# Patient Record
Sex: Female | Born: 1990 | Race: Black or African American | Hispanic: No | Marital: Single | Smoking: Never smoker
Health system: Southern US, Community
[De-identification: ages and names within clinical notes are randomized; demographics above are authoritative.]

## PROBLEM LIST (undated history)

## (undated) ENCOUNTER — Inpatient Hospital Stay (HOSPITAL_COMMUNITY): Payer: Self-pay

## (undated) DIAGNOSIS — Z8759 Personal history of other complications of pregnancy, childbirth and the puerperium: Secondary | ICD-10-CM

## (undated) DIAGNOSIS — Z789 Other specified health status: Secondary | ICD-10-CM

## (undated) DIAGNOSIS — Z8742 Personal history of other diseases of the female genital tract: Secondary | ICD-10-CM

## (undated) HISTORY — PX: NO PAST SURGERIES: SHX2092

## (undated) HISTORY — DX: Personal history of other diseases of the female genital tract: Z87.42

---

## 2010-11-05 ENCOUNTER — Emergency Department (HOSPITAL_COMMUNITY)
Admission: EM | Admit: 2010-11-05 | Discharge: 2010-11-06 | Disposition: A | Discharge status: Home / Self Care | Payer: BC Managed Care – PPO | Attending: Emergency Medicine | Admitting: Emergency Medicine

## 2010-11-05 ENCOUNTER — Encounter: Payer: Self-pay | Admitting: *Deleted

## 2010-11-05 DIAGNOSIS — R102 Pelvic and perineal pain: Secondary | ICD-10-CM

## 2010-11-05 DIAGNOSIS — N949 Unspecified condition associated with female genital organs and menstrual cycle: Secondary | ICD-10-CM | POA: Insufficient documentation

## 2010-11-05 DIAGNOSIS — A499 Bacterial infection, unspecified: Secondary | ICD-10-CM | POA: Insufficient documentation

## 2010-11-05 DIAGNOSIS — B9689 Other specified bacterial agents as the cause of diseases classified elsewhere: Secondary | ICD-10-CM | POA: Insufficient documentation

## 2010-11-05 DIAGNOSIS — M533 Sacrococcygeal disorders, not elsewhere classified: Secondary | ICD-10-CM | POA: Insufficient documentation

## 2010-11-05 DIAGNOSIS — R109 Unspecified abdominal pain: Secondary | ICD-10-CM | POA: Insufficient documentation

## 2010-11-05 DIAGNOSIS — N760 Acute vaginitis: Secondary | ICD-10-CM | POA: Insufficient documentation

## 2010-11-05 LAB — WET PREP, GENITAL

## 2010-11-05 LAB — URINALYSIS, ROUTINE W REFLEX MICROSCOPIC
Ketones, ur: NEGATIVE mg/dL
Leukocytes, UA: NEGATIVE
Nitrite: NEGATIVE

## 2010-11-05 LAB — URINE MICROSCOPIC-ADD ON

## 2010-11-05 LAB — POCT PREGNANCY, URINE: Preg Test, Ur: NEGATIVE

## 2010-11-05 MED ORDER — IBUPROFEN 800 MG PO TABS
800.0000 mg | ORAL_TABLET | Freq: Once | ORAL | Status: AC
  Administered 2010-11-05: 800 mg via ORAL
  Filled 2010-11-05: qty 1

## 2010-11-05 NOTE — ED Notes (Signed)
C/o abdominal pain and back pain x one week, c/o pain in coccyx area

## 2010-11-05 NOTE — ED Provider Notes (Signed)
Scribed for Glynn Octave, MD, the patient was seen in room APA03/APA03. This chart was scribed by [employer]. The patient's care started at 22:05  CSN: [id number] Arrival date & time: 11/05/2010  9:54 PM  Chief Complaint  Patient presents with  . Abdominal Cramping   HPI Nancy Strong is a 20 y.o. female who presents to the Emergency Department complaining of lower abdominal pain with tail bone pain. Patient denies any injuries, vomiting, fever, loss of appetite, vaginal bleeding or discharge. Patient has no history of abdominal surgeries. LNMP June 2012. There are no other associated symptoms and no other alleviating or aggravating factors.  History reviewed. No pertinent past medical history.  History reviewed. No pertinent past surgical history.  No family history on file.  History  Substance Use Topics  . Smoking status: Not on file  . Smokeless tobacco: Not on file  . Alcohol Use: Yes     occassional    OB History    Grav Para Term Preterm Abortions TAB SAB Ect Mult Living                  Review of Systems  Constitutional: Negative for fever and appetite change.  Gastrointestinal: Negative for nausea, vomiting and diarrhea.  Genitourinary: Negative for vaginal bleeding and vaginal discharge.  All other systems reviewed and are negative.    Physical Exam  BP 116/85  Pulse 78  Temp(Src) 98.6 F (37 C) (Oral)  Resp 20  SpO2 100%  LMP 08/18/2010  Physical Exam  Nursing note and vitals reviewed. Constitutional: She is oriented to person, place, and time. She appears well-developed and well-nourished. No distress.  HENT:  Head: Normocephalic and atraumatic.  Mouth/Throat: Oropharynx is clear and moist.  Eyes: Conjunctivae and EOM are normal. Pupils are equal, round, and reactive to light.  Neck: Tracheal deviation present.  Cardiovascular: Normal rate and regular rhythm.   Pulmonary/Chest: Effort normal and breath sounds normal. No respiratory  distress. She has no wheezes.  Abdominal: Soft. Bowel sounds are normal. She exhibits no distension. There is tenderness (suprapubic). There is no rebound and no guarding.       No erythema, no fluctuence no skin change  Genitourinary: Vagina normal and uterus normal. Cervix exhibits no motion tenderness, no discharge and no friability. Right adnexum displays no mass and no tenderness. Left adnexum displays no mass and no tenderness. No vaginal discharge found.  Musculoskeletal: Normal range of motion. She exhibits no edema and no tenderness.       Back:  Neurological: She is alert and oriented to person, place, and time. No cranial nerve deficit.  Skin: Skin is warm and dry. No rash noted. She is not diaphoretic. No erythema. No pallor.  Psychiatric: She has a normal mood and affect. Her behavior is normal.    ED Course  Procedures  OTHER DATA REVIEWED: Nursing notes, vital signs, and past medical records reviewed.    DIAGNOSTIC STUDIES: Oxygen Saturation is 100% on room air, normal by my interpretation.    LABS / RADIOLOGY:  Results for orders placed during the hospital encounter of 11/05/10  URINALYSIS, ROUTINE W REFLEX MICROSCOPIC      Component Value Range   Color, Urine YELLOW  YELLOW    Appearance CLEAR  CLEAR    Specific Gravity, Urine >1.030 (*) 1.005 - 1.030    pH 6.0  5.0 - 8.0    Glucose, UA NEGATIVE  NEGATIVE (mg/dL)   Hgb urine dipstick LARGE (*) NEGATIVE  Bilirubin Urine NEGATIVE  NEGATIVE    Ketones, ur NEGATIVE  NEGATIVE (mg/dL)   Protein, ur TRACE (*) NEGATIVE (mg/dL)   Urobilinogen, UA 0.2  0.0 - 1.0 (mg/dL)   Nitrite NEGATIVE  NEGATIVE    Leukocytes, UA NEGATIVE  NEGATIVE   WET PREP, GENITAL      Component Value Range   Yeast, Wet Prep NONE SEEN  NONE SEEN    Trich, Wet Prep NONE SEEN  NONE SEEN    Clue Cells, Wet Prep FEW (*) NONE SEEN    WBC, Wet Prep HPF POC FEW (*) NONE SEEN   POCT PREGNANCY, URINE      Component Value Range   Preg Test, Ur  NEGATIVE    URINE MICROSCOPIC-ADD ON      Component Value Range   Squamous Epithelial / LPF MANY (*) RARE    WBC, UA 3-6  <3 (WBC/hpf)   RBC / HPF 7-10  <3 (RBC/hpf)   Bacteria, UA FEW (*) RARE      ED COURSE / COORDINATION OF CARE: 22:20 - EDMD examined patient and ordered the folloing Orders Placed This Encounter  Procedures  . Wet prep, genital  . Urinalysis with microscopic  . GC/chlamydia probe amp, genital  . Pelvic cart  . Pregnancy, urine POC     MDM: suprapubic pain without urinary or vaginal symptoms.  Currently on period.  No abnormality seen at coccyx.  UA, pelvic, HCG  No abnormality on pelvic exam. We'll treat for bacterial vaginosis. No evidence of urinary tract infection.   MEDICATIONS GIVEN IN THE E.D.  Medications  ibuprofen (ADVIL,MOTRIN) tablet 800 mg (not administered)    SCRIBE ATTESTATION: I personally performed the services described in this documentation, which was scribed in my presence.  The recorded information has been reviewed and considered.        Glynn Octave, MD 11/06/10 0005

## 2010-11-06 MED ORDER — METRONIDAZOLE 500 MG PO TABS: 500.0000 mg | ORAL_TABLET | Freq: Two times a day (BID) | ORAL | Status: AC

## 2010-11-06 NOTE — ED Notes (Signed)
Pt self ambulated out with a steady gait stating no needs 

## 2010-11-09 LAB — GC/CHLAMYDIA PROBE AMP, GENITAL: Chlamydia, DNA Probe: NEGATIVE

## 2011-07-05 ENCOUNTER — Ambulatory Visit (INDEPENDENT_AMBULATORY_CARE_PROVIDER_SITE_OTHER): Payer: BC Managed Care – PPO | Admitting: Internal Medicine

## 2011-07-05 VITALS — BP 104/68 | HR 75 | Temp 97.9°F | Resp 16 | Ht 60.5 in | Wt 99.0 lb

## 2011-07-05 DIAGNOSIS — R0789 Other chest pain: Secondary | ICD-10-CM

## 2011-07-05 DIAGNOSIS — J301 Allergic rhinitis due to pollen: Secondary | ICD-10-CM

## 2011-07-05 MED ORDER — PREDNISONE 20 MG PO TABS: ORAL_TABLET | ORAL | Status: DC

## 2011-07-05 MED ORDER — FLUTICASONE PROPIONATE 50 MCG/ACT NA SUSP: 2.0000 | Freq: Every day | NASAL | Status: DC

## 2011-07-05 MED ORDER — LORATADINE 10 MG PO CAPS: 10.0000 mg | ORAL_CAPSULE | Freq: Every day | ORAL | Status: DC

## 2011-07-05 NOTE — Progress Notes (Signed)
  Subjective:    Patient ID: Nancy Strong, female    DOB: 1990-07-03, 21 y.o.   MRN: [id number]  HPIFor the past 2 weeks she has been waking up in the middle of the night with a right frontal headache as dull and aching and pressure-like/there is no change in vision, no nausea and vomiting, and no dizziness. She has no history of migraine headaches. 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 Wynne Dust who follows her abnormal Pap smears status post LEEP/she would like to change to an implant or injectable form and will discuss this with her    Objective:   Physical ExamVital signs stable Conjunctiva injected/PE RR LA/EOMs conjugate TMs clear Nares boggy Throat slightly injected No nodes Chest clear Chest wall tender in the right fourth interspace Abdomen is benign      Assessment & Plan:  Problem #1 allergic rhinitis with cough Problem #2 chest wall pain secondary to cough  Reassured Meds ordered this encounter  Medications  . DISCONTD: norethindrone-ethinyl estradiol-iron (MICROGESTIN FE,GILDESS FE,LOESTRIN FE) 1.5-30 MG-MCG tablet    Sig: Take 1 tablet by mouth daily.  . predniSONE (DELTASONE) 20 MG tablet    Sig: 3/3/2/2/1/1 single daily dose for 6 days    Dispense:  12 tablet    Refill:  0  . fluticasone (FLONASE) 50 MCG/ACT nasal spray    Sig: Place 2 sprays into the nose daily.    Dispense:  16 g    Refill:  6    . Loratadine 10 MG CAPS    Sig: Take 1 capsule (10 mg total) by mouth daily.    Dispense:  30 each    Refill:  11   Actually the birth control pills were not discontinued and she gets this prescription from Dr. Hyacinth Meeker 2 followup as needed for primary care

## 2012-05-25 ENCOUNTER — Emergency Department (HOSPITAL_COMMUNITY): Payer: BC Managed Care – PPO

## 2012-05-25 ENCOUNTER — Emergency Department (HOSPITAL_COMMUNITY)
Admission: EM | Admit: 2012-05-25 | Discharge: 2012-05-25 | Disposition: A | Discharge status: Home / Self Care | Payer: BC Managed Care – PPO | Attending: Emergency Medicine | Admitting: Emergency Medicine

## 2012-05-25 ENCOUNTER — Encounter (HOSPITAL_COMMUNITY): Payer: Self-pay | Admitting: *Deleted

## 2012-05-25 DIAGNOSIS — Z3201 Encounter for pregnancy test, result positive: Secondary | ICD-10-CM | POA: Insufficient documentation

## 2012-05-25 DIAGNOSIS — R197 Diarrhea, unspecified: Secondary | ICD-10-CM | POA: Insufficient documentation

## 2012-05-25 DIAGNOSIS — Z87891 Personal history of nicotine dependence: Secondary | ICD-10-CM | POA: Insufficient documentation

## 2012-05-25 DIAGNOSIS — R112 Nausea with vomiting, unspecified: Secondary | ICD-10-CM | POA: Insufficient documentation

## 2012-05-25 DIAGNOSIS — O200 Threatened abortion: Secondary | ICD-10-CM

## 2012-05-25 DIAGNOSIS — F17200 Nicotine dependence, unspecified, uncomplicated: Secondary | ICD-10-CM | POA: Insufficient documentation

## 2012-05-25 LAB — WET PREP, GENITAL
Clue Cells Wet Prep HPF POC: NONE SEEN
Trich, Wet Prep: NONE SEEN
Yeast Wet Prep HPF POC: NONE SEEN

## 2012-05-25 LAB — COMPREHENSIVE METABOLIC PANEL
ALT: 6 U/L (ref 0–35)
AST: 12 U/L (ref 0–37)
Albumin: 3.6 g/dL (ref 3.5–5.2)
Alkaline Phosphatase: 44 U/L (ref 39–117)
Chloride: 102 mEq/L (ref 96–112)
Potassium: 3.4 mEq/L — ABNORMAL LOW (ref 3.5–5.1)
Sodium: 135 mEq/L (ref 135–145)
Total Bilirubin: 0.6 mg/dL (ref 0.3–1.2)

## 2012-05-25 LAB — CBC WITH DIFFERENTIAL/PLATELET
Basophils Absolute: 0 10*3/uL (ref 0.0–0.1)
Basophils Relative: 0 % (ref 0–1)
HCT: 34.9 % — ABNORMAL LOW (ref 36.0–46.0)
MCH: 29.8 pg (ref 26.0–34.0)
MCV: 85.3 fL (ref 78.0–100.0)
Platelets: 199 10*3/uL (ref 150–400)
RBC: 4.09 MIL/uL (ref 3.87–5.11)

## 2012-05-25 LAB — URINALYSIS, ROUTINE W REFLEX MICROSCOPIC
Bilirubin Urine: NEGATIVE
Ketones, ur: 15 mg/dL — AB
Nitrite: NEGATIVE
pH: 6 (ref 5.0–8.0)

## 2012-05-25 LAB — URINE MICROSCOPIC-ADD ON

## 2012-05-25 LAB — ABO/RH: ABO/RH(D): A NEG

## 2012-05-25 MED ORDER — MORPHINE SULFATE 4 MG/ML IJ SOLN
4.0000 mg | Freq: Once | INTRAMUSCULAR | Status: AC
  Administered 2012-05-25: 4 mg via INTRAVENOUS
  Filled 2012-05-25: qty 1

## 2012-05-25 MED ORDER — RHO D IMMUNE GLOBULIN 1500 UNIT/2ML IJ SOLN: 300.0000 ug | Freq: Once | INTRAMUSCULAR | Status: DC

## 2012-05-25 MED ORDER — SODIUM CHLORIDE 0.9 % IV BOLUS (SEPSIS)
1000.0000 mL | Freq: Once | INTRAVENOUS | Status: AC
  Administered 2012-05-25: 1000 mL via INTRAVENOUS

## 2012-05-25 MED ORDER — ONDANSETRON HCL 4 MG/2ML IJ SOLN
4.0000 mg | Freq: Once | INTRAMUSCULAR | Status: AC
  Administered 2012-05-25: 4 mg via INTRAVENOUS
  Filled 2012-05-25: qty 2

## 2012-05-25 MED ORDER — RHO D IMMUNE GLOBULIN 1500 UNIT/2ML IJ SOLN
50.0000 ug | Freq: Once | INTRAMUSCULAR | Status: AC
  Administered 2012-05-25: 49.5 ug via INTRAMUSCULAR

## 2012-05-25 MED ORDER — SODIUM CHLORIDE 0.9 % IV BOLUS (SEPSIS): 1000.0000 mL | Freq: Once | INTRAVENOUS | Status: DC

## 2012-05-25 NOTE — ED Notes (Signed)
Patient transported to Ultrasound 

## 2012-05-25 NOTE — Progress Notes (Signed)
[facility] ED CM noted pt with coverage but no pcp listed Spoke with pt who confirms no pcp but aware of how to obtain one with insurance coverage customer service number or web site  

## 2012-05-25 NOTE — ED Provider Notes (Signed)
History     CSN: [id number]  Arrival date & time 05/25/12  4098   First MD Initiated Contact with Patient 05/25/12 [phone]      Chief Complaint  Patient presents with  . Abdominal Pain  . Emesis  . Diarrhea    (Consider location/radiation/quality/duration/timing/severity/associated sxs/prior treatment) HPI Comments: Patient presents with severe lower abdominal cramping that started this morning 8am. She reports irregular periods in February and started bleeding yesterday with clots. She said several episodes of nausea and vomiting this morning with loose stools. Denies any fevers. Denies any dysuria hematuria. Has had intermittent cramps over the past 2 weeks but never this severe. No previous history of gynecological problems. She's never been pregnant. She denies any chest pain or shortness of breath. She states it's hard to breathe though because of the pain.  The history is provided by the patient and a relative. The history is limited by the condition of the patient.    History reviewed. No pertinent past medical history.  History reviewed. No pertinent past surgical history.  No family history on file.  History  Substance Use Topics  . Smoking status: Former Smoker -- 0.50 packs/day  . Smokeless tobacco: Not on file  . Alcohol Use: Yes     Comment: occassional    OB History   Grav Para Term Preterm Abortions TAB SAB Ect Mult Living                  Review of Systems  Constitutional: Negative for activity change and appetite change.  HENT: Negative for nosebleeds and congestion.   Respiratory: Negative for cough, chest tightness and shortness of breath.   Cardiovascular: Negative for chest pain.  Gastrointestinal: Positive for vomiting, abdominal pain and diarrhea.  Genitourinary: Positive for vaginal bleeding. Negative for dysuria and hematuria.  Musculoskeletal: Negative for back pain.  Skin: Negative for wound.  Neurological: Positive for weakness. Negative for  dizziness and headaches.  A complete 10 system review of systems was obtained and all systems are negative except as noted in the HPI and PMH.    Allergies  Review of patient's allergies indicates no known allergies.  Home Medications   Current Outpatient Rx  Name  Route  Sig  Dispense  Refill  . ibuprofen (ADVIL,MOTRIN) 200 MG tablet   Oral   Take 400 mg by mouth every 8 (eight) hours as needed for pain.           BP 124/53  Pulse 104  Temp(Src) 97.7 F (36.5 C) (Oral)  Resp 16  SpO2 100%  LMP 05/11/2012  Physical Exam  Constitutional: She is oriented to person, place, and time. She appears well-developed and well-nourished. She appears distressed.  Uncomfortable, hyperventilating  HENT:  Head: Normocephalic and atraumatic.  Mouth/Throat: Oropharynx is clear and moist. No oropharyngeal exudate.  Eyes: Conjunctivae are normal. Pupils are equal, round, and reactive to light.  Neck: Normal range of motion. Neck supple.  Cardiovascular: Normal rate, regular rhythm and normal heart sounds.   No murmur heard. Pulmonary/Chest: Effort normal and breath sounds normal. No respiratory distress.  Abdominal: Soft. There is tenderness. There is no rebound and no guarding.  Suprapubic tenderness without peritoneal signs No pain at McBurney's point  Genitourinary: Cervix exhibits no motion tenderness. Right adnexum displays no mass and no tenderness. Left adnexum displays no mass and no tenderness. There is bleeding around the vagina.  Dark red blood in the vaginal vault, cervix closed, no adnexal tenderness or CMT  Musculoskeletal: Normal range of motion. She exhibits no edema and no tenderness.  No CVAT  Neurological: She is alert and oriented to person, place, and time. No cranial nerve deficit. She exhibits normal muscle tone. Coordination normal.  Skin: Skin is warm.    ED Course  Procedures (including critical care time)  Labs Reviewed  WET PREP, GENITAL - Abnormal;  Notable for the following:    WBC, Wet Prep HPF POC FEW (*)    All other components within normal limits  URINALYSIS, ROUTINE W REFLEX MICROSCOPIC - Abnormal; Notable for the following:    Color, Urine RED (*)    APPearance CLOUDY (*)    Specific Gravity, Urine 1.031 (*)    Hgb urine dipstick LARGE (*)    Ketones, ur 15 (*)    Protein, ur 100 (*)    Leukocytes, UA SMALL (*)    All other components within normal limits  PREGNANCY, URINE - Abnormal; Notable for the following:    Preg Test, Ur POSITIVE (*)    All other components within normal limits  CBC WITH DIFFERENTIAL - Abnormal; Notable for the following:    HCT 34.9 (*)    All other components within normal limits  COMPREHENSIVE METABOLIC PANEL - Abnormal; Notable for the following:    Potassium 3.4 (*)    Glucose, Bld 119 (*)    All other components within normal limits  HCG, QUANTITATIVE, PREGNANCY - Abnormal; Notable for the following:    hCG, Beta Chain, Quant, S 336 (*)    All other components within normal limits  GC/CHLAMYDIA PROBE AMP  URINE MICROSCOPIC-ADD ON  ABO/RH  RH IG WORKUP (INCLUDES ABO/RH)   US Ob Comp Less 14 Wks  05/25/2012  *RADIOLOGY REPORT*  Clinical Data: Abdominal pain and emesis.  Quantitative beta HCG 336  OBSTETRIC <14 WK [place] AND TRANSVAGINAL OB US  Technique:  Both transabdominal and transvaginal ultrasound examinations were performed for complete evaluation of the gestation as well as the maternal uterus, adnexal regions, and pelvic cul-de-sac.  Transvaginal technique was performed to assess early pregnancy.  Comparison:  None.  Intrauterine gestational sac:  Not seen Yolk sac: Not applicable Embryo: Not applicable Cardiac Activity: Not applicable Heart Rate: Not applicable bpm  A thin endometrial stripe is identified measuring 6 mm in diameter with no definite areas of thickening.  Maternal uterus/adnexae: Both ovaries are seen with the left ovary measuring 2.5 x 3.5 x 2.2 cm and the right ovary  measuring 3.1 x 3.1 x 2.6 cm.  Medial and posterior to the right ovary and appearing separate from the right ovary on cine loop evaluation is a hyperechoic rounded soft tissue density measuring 1.6 by 1.6 x 1.7 cm.  This demonstrates some peripheral flow with color Doppler exam and the appearance is suspicious for a right ectopic gestation. A small amount of simple free fluid is noted in the cul-de-sac.  No associated complex fluid is identified.  IMPRESSION: No evidence for an intrauterine gestational sac.  Rounded soft tissue mass appearing extraovarian in the right adnexa as described above with associated color flow with color Doppler assessment is suspicious for right ectopic gestation.  Normal ovaries.  This study was called as a critical result to Dr. Manus Gunning on 05/25/2012 at 11:50 am   Original Report Authenticated By: Rhodia Albright, M.D.    US Ob Transvaginal  05/25/2012  *RADIOLOGY REPORT*  Clinical Data: Abdominal pain and emesis.  Quantitative beta HCG 336  OBSTETRIC <14 WK [place] AND TRANSVAGINAL  OB US  Technique:  Both transabdominal and transvaginal ultrasound examinations were performed for complete evaluation of the gestation as well as the maternal uterus, adnexal regions, and pelvic cul-de-sac.  Transvaginal technique was performed to assess early pregnancy.  Comparison:  None.  Intrauterine gestational sac:  Not seen Yolk sac: Not applicable Embryo: Not applicable Cardiac Activity: Not applicable Heart Rate: Not applicable bpm  A thin endometrial stripe is identified measuring 6 mm in diameter with no definite areas of thickening.  Maternal uterus/adnexae: Both ovaries are seen with the left ovary measuring 2.5 x 3.5 x 2.2 cm and the right ovary measuring 3.1 x 3.1 x 2.6 cm.  Medial and posterior to the right ovary and appearing separate from the right ovary on cine loop evaluation is a hyperechoic rounded soft tissue density measuring 1.6 by 1.6 x 1.7 cm.  This demonstrates some peripheral flow  with color Doppler exam and the appearance is suspicious for a right ectopic gestation. A small amount of simple free fluid is noted in the cul-de-sac.  No associated complex fluid is identified.  IMPRESSION: No evidence for an intrauterine gestational sac.  Rounded soft tissue mass appearing extraovarian in the right adnexa as described above with associated color flow with color Doppler assessment is suspicious for right ectopic gestation.  Normal ovaries.  This study was called as a critical result to Dr. Manus Gunning on 05/25/2012 at 11:50 am   Original Report Authenticated By: Rhodia Albright, M.D.      No diagnosis found.    MDM  Severe lower abdominal cramping with vaginal bleeding since this morning. Associated with nausea, vomiting, weakness, diarrhea. Unclear pregnancy test status.  Pregnancy test positive. Pelvic exam benign. Ultrasound will be obtained with beta hCG and Rh  HCG 336. Rh negative. Given Rhogam. Patient has suspicious right adnexal mass measuring 1.6 x 1.6 x 1.6 cm. This was discussed with Dr. Debroah Loop of obstetrics. As patient is a hemodynamically stable and pain-free at this time without significant bleeding, he would like her to return to [facility] hospital in 48 hours for a hCG recheck.  Patient understands need for close followup in 48 hours.  Instructed to return to [facility] hospital sooner with worsening pain, worsening bleeding, dizziness, lightheadedness or any other concerns.  Glynn Octave, MD 05/25/12 1550

## 2012-05-25 NOTE — ED Notes (Signed)
Pt aware of the need for a urine sample. 

## 2012-05-25 NOTE — ED Notes (Addendum)
Pt reports abd pain x2 weeks. Pain unbearable since waking this am approx 8am. Emesis x1, diarrhea x5. Pt reports she is on her period and has been passing large clots. Reports a syncopal episode this am in bathroom. Lower central abd pain. Pt reports missing Feb period, periods irregular. Sts period she is having now is unusual for her, started and stopped and restarted.

## 2012-05-26 LAB — RH IG WORKUP (INCLUDES ABO/RH): Antibody Screen: NEGATIVE

## 2012-05-27 ENCOUNTER — Inpatient Hospital Stay (HOSPITAL_COMMUNITY)
Admission: AD | Admit: 2012-05-27 | Discharge: 2012-05-27 | Disposition: A | Discharge status: Home / Self Care | Payer: BC Managed Care – PPO | Source: Ambulatory Visit | Attending: Obstetrics and Gynecology | Admitting: Obstetrics and Gynecology

## 2012-05-27 DIAGNOSIS — O009 Unspecified ectopic pregnancy without intrauterine pregnancy: Secondary | ICD-10-CM

## 2012-05-27 DIAGNOSIS — O00109 Unspecified tubal pregnancy without intrauterine pregnancy: Secondary | ICD-10-CM | POA: Insufficient documentation

## 2012-05-27 LAB — CBC WITH DIFFERENTIAL/PLATELET
Basophils Relative: 1 % (ref 0–1)
Eosinophils Absolute: 0 10*3/uL (ref 0.0–0.7)
Eosinophils Relative: 1 % (ref 0–5)
Hemoglobin: 11.7 g/dL — ABNORMAL LOW (ref 12.0–15.0)
Lymphs Abs: 1.5 10*3/uL (ref 0.7–4.0)
MCH: 30.2 pg (ref 26.0–34.0)
MCHC: 35 g/dL (ref 30.0–36.0)
MCV: 86.3 fL (ref 78.0–100.0)
Monocytes Absolute: 0.3 10*3/uL (ref 0.1–1.0)
Monocytes Relative: 8 % (ref 3–12)
RBC: 3.87 MIL/uL (ref 3.87–5.11)

## 2012-05-27 LAB — CREATININE, SERUM: GFR calc Af Amer: >90 mL/min (ref >90)

## 2012-05-27 MED ORDER — METHOTREXATE INJECTION FOR WOMEN'S HOSPITAL
50.0000 mg/m2 | Freq: Once | INTRAMUSCULAR | Status: AC
  Administered 2012-05-27: 70 mg via INTRAMUSCULAR
  Filled 2012-05-27: qty 1.4

## 2012-05-27 NOTE — MAU Provider Note (Signed)
Attestation of Attending Supervision of Advanced Practitioner: Evaluation and management procedures were performed by the PA/NP/CNM/OB Fellow under my supervision/collaboration. Chart reviewed and agree with management and plan.  Tilda Burrow 05/27/2012 9:38 PM

## 2012-05-27 NOTE — Progress Notes (Signed)
Pt waiting for Lab results and methotrexate injection. POC discussed with pt by Dr. Emelda Fear. Pt agreeable.

## 2012-05-27 NOTE — MAU Note (Signed)
Pt here for f/u BHCG. Told she may have an ectopic pregnancy and to f/u  At MAU in 48 hr. Pt reports no pain and bleeding has decreased a lot.

## 2012-05-27 NOTE — MAU Provider Note (Signed)
History     CSN: [id number]  Arrival date and time: 05/27/12 1236   First Provider Initiated Contact with Patient 05/27/12 1326      Chief Complaint  Patient presents with  . Follow-up   HPI Nancy Strong 22 y.o.   LMP 05-11-12.  Was seen in [facility] ED on 05-25-12 and diagnosed with early pregnancy.  Quant was 336 and U/S did not show IUGS.  Suspicious for ectopic pregnancy and so is following up for quant today.  Did receive Rhogam at last visit as blood type is A negative.   OB History   Grav Para Term Preterm Abortions TAB SAB Ect Mult Living                  No past medical history on file.  No past surgical history on file.  No family history on file.  History  Substance Use Topics  . Smoking status: Former Smoker -- 0.50 packs/day  . Smokeless tobacco: Not on file  . Alcohol Use: Yes     Comment: occassional    Allergies: No Known Allergies  Prescriptions prior to admission  Medication Sig Dispense Refill  . ibuprofen (ADVIL,MOTRIN) 200 MG tablet Take 400 mg by mouth every 8 (eight) hours as needed for pain.        Review of Systems  Constitutional: Negative for fever.  Gastrointestinal: Negative for abdominal pain.  Genitourinary: Negative for dysuria.       Vaginal bleeding   Physical Exam   Blood pressure 120/71, pulse 81, resp. rate 18, height 5\' 2"  (1.575 m), weight 102 lb 3.2 oz (46.358 kg), last menstrual period 05/11/2012.  Physical Exam  Nursing note and vitals reviewed. Constitutional: She is oriented to person, place, and time. She appears well-developed and well-nourished. No distress.  HENT:  Head: Normocephalic.  Eyes: EOM are normal.  Neck: Neck supple.  Musculoskeletal: Normal range of motion.  Neurological: She is alert and oriented to person, place, and time.  Skin: Skin is warm and dry.  Psychiatric: She has a normal mood and affect.    MAU Course  Procedures Results for orders placed during the hospital encounter of  05/27/12 (from the past 24 hour(s))  HCG, QUANTITATIVE, PREGNANCY     Status: Abnormal   Collection Time    05/27/12  1:15 PM      Result Value Range   hCG, Beta Chain, Quant, S 170 (*) <5 mIU/mL  CBC WITH DIFFERENTIAL     Status: Abnormal   Collection Time    05/27/12  1:15 PM      Result Value Range   WBC 3.6 (*) 4.0 - 10.5 K/uL   RBC 3.87  3.87 - 5.11 MIL/uL   Hemoglobin 11.7 (*) 12.0 - 15.0 g/dL   HCT 11.9 (*) 14.7 - 82.9 %   MCV 86.3  78.0 - 100.0 fL   MCH 30.2  26.0 - 34.0 pg   MCHC 35.0  30.0 - 36.0 g/dL   RDW 56.2  13.0 - 86.5 %   Platelets 215  150 - 400 K/uL   Neutrophils Relative 49  43 - 77 %   Neutro Abs 1.8  1.7 - 7.7 K/uL   Lymphocytes Relative 41  12 - 46 %   Lymphs Abs 1.5  0.7 - 4.0 K/uL   Monocytes Relative 8  3 - 12 %   Monocytes Absolute 0.3  0.1 - 1.0 K/uL   Eosinophils Relative 1  0 - 5 %  Eosinophils Absolute 0.0  0.0 - 0.7 K/uL   Basophils Relative 1  0 - 1 %   Basophils Absolute 0.0  0.0 - 0.1 K/uL  AST     Status: None   Collection Time    05/27/12  1:15 PM      Result Value Range   AST 12  0 - 37 U/L  BUN     Status: None   Collection Time    05/27/12  1:15 PM      Result Value Range   BUN 10  6 - 23 mg/dL  CREATININE, SERUM     Status: None   Collection Time    05/27/12  1:15 PM      Result Value Range   Creatinine, Ser 0.80  0.50 - 1.10 mg/dL   GFR calc non Af Amer >90  >90 mL/min   GFR calc Af Amer >90  >90 mL/min   MDM 1440  Dr Emelda Fear in to talk with client re: plan of care - will give Methotrexate.  Assessment and Plan  Ectopic pregnancy  Plan  Methotrexate given Return on Wednesday (Day 4)  and Saturday  (Day 7) for repeat labs Return sooner if you have severe pain or severe bleeding. Nothing in the vagina - no sex, no douching, no tampons.  Nancy Strong 05/27/2012, 1:27 PM

## 2012-05-30 ENCOUNTER — Inpatient Hospital Stay (HOSPITAL_COMMUNITY)
Admission: AD | Admit: 2012-05-30 | Discharge: 2012-05-30 | Disposition: A | Discharge status: Home / Self Care | Payer: BC Managed Care – PPO | Source: Ambulatory Visit | Attending: Obstetrics & Gynecology | Admitting: Obstetrics & Gynecology

## 2012-05-30 ENCOUNTER — Encounter (HOSPITAL_COMMUNITY): Payer: Self-pay | Admitting: *Deleted

## 2012-05-30 DIAGNOSIS — O009 Unspecified ectopic pregnancy without intrauterine pregnancy: Secondary | ICD-10-CM

## 2012-05-30 DIAGNOSIS — O00109 Unspecified tubal pregnancy without intrauterine pregnancy: Secondary | ICD-10-CM | POA: Insufficient documentation

## 2012-05-30 HISTORY — DX: Other specified health status: Z78.9

## 2012-05-30 LAB — HCG, QUANTITATIVE, PREGNANCY: hCG, Beta Chain, Quant, S: 38 m[IU]/mL — ABNORMAL HIGH (ref <5)

## 2012-05-30 MED ORDER — ONDANSETRON 8 MG PO TBDP
8.0000 mg | ORAL_TABLET | Freq: Once | ORAL | Status: AC
  Administered 2012-05-30: 8 mg via ORAL
  Filled 2012-05-30: qty 1

## 2012-05-30 MED ORDER — ONDANSETRON 8 MG PO TBDP
8.0000 mg | ORAL_TABLET | Freq: Three times a day (TID) | ORAL | Status: DC | PRN
Start: 2012-05-30 — End: 2020-07-23

## 2012-05-30 NOTE — MAU Provider Note (Signed)
  History     CSN: [id number]  Arrival date and time: 05/30/12 1105   provider seen at 12:44 pm    Chief Complaint  Patient presents with  . Follow-up   HPI Nancy Strong 22 y.o. Comes in for Day 4 quant after methotrexate on 05-27-12.  Quant was 170 that day.  Reports no pain and no bleeding today.  OB History   Grav Para Term Preterm Abortions TAB SAB Ect Mult Living                  No past medical history on file.  No past surgical history on file.  No family history on file.  History  Substance Use Topics  . Smoking status: Former Smoker -- 0.50 packs/day  . Smokeless tobacco: Not on file  . Alcohol Use: Yes     Comment: occassional    Allergies: No Known Allergies  No prescriptions prior to admission    Review of Systems  Gastrointestinal: Negative for abdominal pain.  Genitourinary:       No abdominal pain.   Physical Exam   Blood pressure 115/81, pulse 85, temperature 97.9 F (36.6 C), temperature source Oral, resp. rate 16, last menstrual period 05/11/2012, SpO2 100.00%.  Physical Exam  Nursing note and vitals reviewed. Constitutional: She is oriented to person, place, and time. She appears well-developed and well-nourished.  HENT:  Head: Normocephalic.  Eyes: EOM are normal.  Neck: Neck supple.  Musculoskeletal: Normal range of motion.  Neurological: She is alert and oriented to person, place, and time.  Skin: Skin is warm and dry.  Psychiatric: She has a normal mood and affect.    MAU Course  Procedures Results for orders placed during the hospital encounter of 05/30/12 (from the past 24 hour(s))  HCG, QUANTITATIVE, PREGNANCY     Status: Abnormal   Collection Time    05/30/12 11:15 AM      Result Value Range   hCG, Beta Chain, Quant, S 38 (*) <5 mIU/mL   MDM Client was initially fine on arrival to MAU.  Became nauseated while waiting in lobby.  When I came to talk with her she was having shortness of breath.  O2 sat 100%.   Pulse rate was 110-120.  Feeling urge to spit and thought she would vomit.  Has been having vomiting the past couple of days.  Was very tired at home and could not go to work.  Zofran ODT given and Sprite given with crackers.  Client reports to the nurse she was feeling anxious about this pregnancy and the loss.  O2 sat is always 100%.  Pulse rate decreased as client waited in room.  I feel she is stable but will give a note to be out of work until next blood draw on Saturday.  Suspect her symptoms are related to anxiety or to side effects of methotrexate.  Assessment and Plan  Follow up from Methotrexate on 05-27-12  Plan Appropriate falling quant Return as scheduled for repeat labs on Saturday. rx zofran ODT   Linc Renne 05/30/2012, 12:31 PM

## 2012-05-30 NOTE — MAU Note (Signed)
Patient to MAU for day 4 BHCG s/p MTX. Patient denies any pain today and only spotting. States she had heavier bleeding with some abdominal pain last night. States she continues to have nausea.

## 2012-06-02 ENCOUNTER — Inpatient Hospital Stay (HOSPITAL_COMMUNITY)
Admission: AD | Admit: 2012-06-02 | Discharge: 2012-06-02 | Disposition: A | Discharge status: Home / Self Care | Payer: BC Managed Care – PPO | Source: Ambulatory Visit | Attending: Obstetrics & Gynecology | Admitting: Obstetrics & Gynecology

## 2012-06-02 ENCOUNTER — Encounter (HOSPITAL_COMMUNITY): Payer: Self-pay | Admitting: *Deleted

## 2012-06-02 DIAGNOSIS — O009 Unspecified ectopic pregnancy without intrauterine pregnancy: Secondary | ICD-10-CM

## 2012-06-02 DIAGNOSIS — O00109 Unspecified tubal pregnancy without intrauterine pregnancy: Secondary | ICD-10-CM | POA: Insufficient documentation

## 2012-06-02 LAB — HCG, QUANTITATIVE, PREGNANCY: hCG, Beta Chain, Quant, S: 12 m[IU]/mL — ABNORMAL HIGH (ref <5)

## 2012-06-02 NOTE — MAU Note (Signed)
Pt reports no pain or bleeding and she feel "way better".

## 2012-06-02 NOTE — MAU Provider Note (Signed)
History     CSN: [id number]  Arrival date & time 06/02/12  1149   None     Chief Complaint  Patient presents with  . Follow-up    (Consider location/radiation/quality/duration/timing/severity/associated sxs/prior treatment) HPI Nancy Strong is a 22 y.o. G2P0. She is here for f/u BHCG after MTX for R ectopic pregnancy on 3/23. 1st ED visit 3/21 BHCG was 336, U/S showed 1.6x1.6x1.7 cm soft rounded R adnexal tissue mass. Repeat BHCG 3/23 was 170, MTX was given.  BHCG day 4 was 38, she had some N&V. Today denies bleeding, abd pain, dizziness or N&V. Has not needed Zofran.   Past Medical History  Diagnosis Date  . Medical history non-contributory     Past Surgical History  Procedure Laterality Date  . No past surgeries      Family History  Problem Relation Age of Onset  . Hypertension Father     History  Substance Use Topics  . Smoking status: Former Smoker -- 0.50 packs/day    Types: Cigars  . Smokeless tobacco: Not on file  . Alcohol Use: No     Comment: occassional    OB History   Grav Para Term Preterm Abortions TAB SAB Ect Mult Living   2               Review of Systems  Constitutional: Negative for fever, chills and fatigue.  Gastrointestinal: Negative for abdominal pain.  Genitourinary: Negative for vaginal bleeding.  Neurological: Negative for dizziness, syncope and weakness.    Allergies  Review of patient's allergies indicates no known allergies.  Home Medications  No current outpatient prescriptions on file.  BP 129/71  Pulse 103  Temp(Src) 98.2 F (36.8 C)  Resp 18  LMP 05/11/2012  Physical Exam  Constitutional: She appears well-developed and well-nourished.  Musculoskeletal: Normal range of motion.  Neurological: She is alert.    ED Course  Procedures (including critical care time)  Labs Reviewed  HCG, QUANTITATIVE, PREGNANCY - Abnormal; Notable for the following:    hCG, Beta Chain, Quant, S 12 (*)    All other  components within normal limits   No results found.   No diagnosis found.    MDM  A. Ectopic pregnancytreatedw ithMTX, day 7 BHCG down to 12 P. Return 1 wk next [facility] Ectopic precautions reviewed again.

## 2012-06-03 NOTE — MAU Provider Note (Signed)
Attestation of Attending Supervision of Advanced Practitioner (PA/CNM/NP): Evaluation and management procedures were performed by the Advanced Practitioner under my supervision and collaboration.  I have reviewed the Advanced Practitioner's note and chart, and I agree with the management and plan.  Bentley Haralson, MD, FACOG Attending Obstetrician & Gynecologist Faculty Practice, [facility]  

## 2012-06-09 ENCOUNTER — Inpatient Hospital Stay (HOSPITAL_COMMUNITY)
Admission: AD | Admit: 2012-06-09 | Discharge: 2012-06-09 | Disposition: A | Discharge status: Home / Self Care | Payer: BC Managed Care – PPO | Source: Ambulatory Visit | Attending: Family Medicine | Admitting: Family Medicine

## 2012-06-09 DIAGNOSIS — O00109 Unspecified tubal pregnancy without intrauterine pregnancy: Secondary | ICD-10-CM | POA: Insufficient documentation

## 2012-06-09 DIAGNOSIS — O009 Unspecified ectopic pregnancy without intrauterine pregnancy: Secondary | ICD-10-CM

## 2012-06-09 DIAGNOSIS — Z09 Encounter for follow-up examination after completed treatment for conditions other than malignant neoplasm: Secondary | ICD-10-CM

## 2012-06-09 LAB — HCG, QUANTITATIVE, PREGNANCY: hCG, Beta Chain, Quant, S: 1 m[IU]/mL (ref <5)

## 2012-06-09 NOTE — MAU Provider Note (Signed)
  History     CSN: [id number]  Arrival date and time: 06/09/12 1122   None     Chief Complaint  Patient presents with  . Labs Only   HPI Nancy Strong is 22 y.o. G2P0 Unknown weeks presenting for follow up BHCG after treatment for Ectopic pregnancy with MTX.  Her last BHCG was 12.  She denies bleeding or pain today.      Past Medical History  Diagnosis Date  . Medical history non-contributory     Past Surgical History  Procedure Laterality Date  . No past surgeries      Family History  Problem Relation Age of Onset  . Hypertension Father     History  Substance Use Topics  . Smoking status: Former Smoker -- 0.50 packs/day    Types: Cigars  . Smokeless tobacco: Not on file  . Alcohol Use: No     Comment: occassional    Allergies: No Known Allergies  Prescriptions prior to admission  Medication Sig Dispense Refill  . ondansetron (ZOFRAN ODT) 8 MG disintegrating tablet Take 1 tablet (8 mg total) by mouth every 8 (eight) hours as needed for nausea.  12 tablet  0    Review of Systems  Gastrointestinal: Negative for abdominal pain.  Genitourinary:       Neg for vaginal bleeding   Physical Exam   Blood pressure 131/64, pulse 73, temperature 98 F (36.7 C), temperature source Oral, resp. rate 18, last menstrual period 05/11/2012.  Physical Exam  Constitutional: She is oriented to person, place, and time. She appears well-developed and well-nourished. No distress.  HENT:  Head: Normocephalic.  Genitourinary:  Not indicated  Neurological: She is alert and oriented to person, place, and time.  Skin: Skin is warm and dry.  Psychiatric: She has a normal mood and affect. Her behavior is normal.    Results for orders placed during the hospital encounter of 06/09/12 (from the past 24 hour(s))  HCG, QUANTITATIVE, PREGNANCY     Status: None   Collection Time    06/09/12 11:40 AM      Result Value Range   hCG, Beta Chain, Quant, S 1  <5 mIU/mL   MAU Course   Procedures  MDM Form completed by Norva Pavlov for week out of work  Assessment and Plan  A: Follow up after MTX for ectopic pregnancy  P:  Follow up in GYN CLINIC in 2 weeks  Nancy Strong,EVE M 06/09/2012, 12:38 PM

## 2012-06-09 NOTE — MAU Provider Note (Signed)
Chart reviewed and agree with management and plan.  

## 2012-06-09 NOTE — MAU Note (Signed)
Pt presents for repeat lab draw for BHCG, denies any pain

## 2013-04-07 ENCOUNTER — Encounter (HOSPITAL_COMMUNITY): Payer: Self-pay | Admitting: *Deleted

## 2014-01-06 ENCOUNTER — Encounter (HOSPITAL_COMMUNITY): Payer: Self-pay | Admitting: *Deleted

## 2016-08-22 DIAGNOSIS — O00101 Right tubal pregnancy without intrauterine pregnancy: Secondary | ICD-10-CM | POA: Insufficient documentation

## 2019-03-03 DIAGNOSIS — O365990 Maternal care for other known or suspected poor fetal growth, unspecified trimester, not applicable or unspecified: Secondary | ICD-10-CM

## 2019-03-04 DIAGNOSIS — O365990 Maternal care for other known or suspected poor fetal growth, unspecified trimester, not applicable or unspecified: Secondary | ICD-10-CM

## 2020-01-13 ENCOUNTER — Ambulatory Visit: Admit: 2020-01-13 | Discharge status: Against Medical Advice | Payer: Self-pay

## 2020-01-13 ENCOUNTER — Other Ambulatory Visit: Payer: Self-pay

## 2020-01-13 ENCOUNTER — Inpatient Hospital Stay (HOSPITAL_COMMUNITY): Payer: Managed Care, Other (non HMO)

## 2020-01-13 ENCOUNTER — Encounter: Payer: Self-pay | Admitting: Emergency Medicine

## 2020-01-13 ENCOUNTER — Inpatient Hospital Stay (HOSPITAL_COMMUNITY)
Admission: AD | Admit: 2020-01-13 | Discharge: 2020-01-13 | Disposition: A | Discharge status: Home / Self Care | Payer: Managed Care, Other (non HMO) | Attending: Obstetrics and Gynecology | Admitting: Obstetrics and Gynecology

## 2020-01-13 ENCOUNTER — Ambulatory Visit
Admission: EM | Admit: 2020-01-13 | Discharge: 2020-01-13 | Disposition: A | Discharge status: Other Acute Inpatient Hospital | Payer: Managed Care, Other (non HMO) | Attending: Emergency Medicine | Admitting: Emergency Medicine

## 2020-01-13 ENCOUNTER — Encounter (HOSPITAL_COMMUNITY): Payer: Self-pay | Admitting: Obstetrics and Gynecology

## 2020-01-13 DIAGNOSIS — O209 Hemorrhage in early pregnancy, unspecified: Secondary | ICD-10-CM | POA: Diagnosis present

## 2020-01-13 DIAGNOSIS — O0911 Supervision of pregnancy with history of ectopic or molar pregnancy, first trimester: Secondary | ICD-10-CM | POA: Diagnosis not present

## 2020-01-13 DIAGNOSIS — O039 Complete or unspecified spontaneous abortion without complication: Secondary | ICD-10-CM | POA: Diagnosis not present

## 2020-01-13 DIAGNOSIS — O4691 Antepartum hemorrhage, unspecified, first trimester: Secondary | ICD-10-CM | POA: Diagnosis not present

## 2020-01-13 DIAGNOSIS — R109 Unspecified abdominal pain: Secondary | ICD-10-CM | POA: Diagnosis not present

## 2020-01-13 DIAGNOSIS — N939 Abnormal uterine and vaginal bleeding, unspecified: Secondary | ICD-10-CM

## 2020-01-13 DIAGNOSIS — Z3A10 10 weeks gestation of pregnancy: Secondary | ICD-10-CM | POA: Diagnosis not present

## 2020-01-13 DIAGNOSIS — R1030 Lower abdominal pain, unspecified: Secondary | ICD-10-CM

## 2020-01-13 DIAGNOSIS — Z3200 Encounter for pregnancy test, result unknown: Secondary | ICD-10-CM

## 2020-01-13 DIAGNOSIS — O26891 Other specified pregnancy related conditions, first trimester: Secondary | ICD-10-CM | POA: Diagnosis not present

## 2020-01-13 DIAGNOSIS — Z3201 Encounter for pregnancy test, result positive: Secondary | ICD-10-CM | POA: Diagnosis not present

## 2020-01-13 HISTORY — DX: Personal history of other complications of pregnancy, childbirth and the puerperium: Z87.59

## 2020-01-13 LAB — CBC
HCT: 36.8 % (ref 36.0–46.0)
Hemoglobin: 12.3 g/dL (ref 12.0–15.0)
MCH: 29.8 pg (ref 26.0–34.0)
MCHC: 33.4 g/dL (ref 30.0–36.0)
MCV: 89.1 fL (ref 80.0–100.0)
Platelets: 226 10*3/uL (ref 150–400)
RBC: 4.13 MIL/uL (ref 3.87–5.11)
RDW: 11.9 % (ref 11.5–15.5)
WBC: 10 10*3/uL (ref 4.0–10.5)
nRBC: 0 % (ref 0.0–0.2)

## 2020-01-13 LAB — URINALYSIS, COMPLETE (UACMP) WITH MICROSCOPIC
Bilirubin Urine: NEGATIVE
Glucose, UA: NEGATIVE mg/dL
Leukocytes,Ua: NEGATIVE
Nitrite: NEGATIVE
Protein, ur: 100 mg/dL — AB
RBC / HPF: >50 RBC/hpf (ref 0–5)
Specific Gravity, Urine: 1.025 (ref 1.005–1.030)
WBC, UA: NONE SEEN WBC/hpf (ref 0–5)
pH: 6.5 (ref 5.0–8.0)

## 2020-01-13 LAB — WET PREP, GENITAL
Clue Cells Wet Prep HPF POC: NONE SEEN
Sperm: NONE SEEN
Trich, Wet Prep: NONE SEEN
WBC, Wet Prep HPF POC: NONE SEEN
Yeast Wet Prep HPF POC: NONE SEEN

## 2020-01-13 LAB — RH IG WORKUP (INCLUDES ABO/RH)

## 2020-01-13 LAB — HCG, QUANTITATIVE, PREGNANCY: hCG, Beta Chain, Quant, S: 20910 m[IU]/mL — ABNORMAL HIGH (ref <5)

## 2020-01-13 LAB — PREGNANCY, URINE: Preg Test, Ur: POSITIVE — AB

## 2020-01-13 MED ORDER — RHO D IMMUNE GLOBULIN 1500 UNIT/2ML IJ SOSY
300.0000 ug | PREFILLED_SYRINGE | Freq: Once | INTRAMUSCULAR | Status: AC
  Administered 2020-01-13: 300 ug via INTRAMUSCULAR
  Filled 2020-01-13: qty 2

## 2020-01-13 NOTE — MAU Note (Addendum)
.   Nancy Strong is a 29 y.o. at [redacted]w[redacted]d here in MAU reporting: vaginal spotting that started yesterday after intercourse. She states that the bleeding got heavier today so she was seen at [facility]. Patient states they ran a UPT and it was positive. Endorses excess clear watery d/c around 1500. Also having lover abdominal cramping right above her c/s scar that was unbearable earlier but she took 650 mg of tylenol at 1655 that improved the pain. Has hx of ectopics.   Pain score: 5 Vitals:   01/13/20 2006  BP: 132/84  Pulse: 92  Resp: 16  Temp: 98.2 F (36.8 C)  SpO2: 100%

## 2020-01-13 NOTE — ED Triage Notes (Signed)
Patient is being discharged from the Urgent Care and sent to the Emergency Department via POV . Per Dr. Chaney Malling, patient is in need of higher level of care due to vaginal bleeding and positive pregnancy test. Patient is aware and verbalizes understanding of plan of care.  Vitals:   01/13/20 1829  BP: 121/79  Pulse: (!) 104  Resp: 18  Temp: 98.3 F (36.8 C)  SpO2: 100%

## 2020-01-13 NOTE — ED Provider Notes (Signed)
HPI  SUBJECTIVE:  Nancy Strong is a G4, P50 29 y.o. female who presents with acute onset of intermittent, irregular, lower midline abdominal cramping described as contractions".  They last several minutes and then resolve.  She states the pain started at 1540 today.  She states that she feels as if "her water broke", but then she started leaking fluid vaginally, started having vaginal bleeding and was passing clots.  She is not sure if she is passing any tissue.  She denies any other abdominal pain.  No syncope, lightheadedness, dizziness, back pain.  She tried 600 mg of Tylenol with improvement in her symptoms.  She states symptoms are also better with walking, worse with sitting down.  She states this feels somewhat similar, but not as severe as, to the 2 previous ectopic pregnancies that she had that were treated with methotrexate.  She has a past medical history of PCOS, no history of fibroids, endometriosis, ovarian cysts.  LMP: End of August.  She has been irregular since having her baby in December 2020.  She has not taken a pregnancy test at home.  PMD: None.    Past Medical History:  Diagnosis Date  . History of ectopic pregnancy    x 2  . Medical history non-contributory     Past Surgical History:  Procedure Laterality Date  . CESAREAN SECTION      Family History  Problem Relation Age of Onset  . Healthy Father   . Healthy Mother     Social History   Tobacco Use  . Smoking status: Never Smoker  . Smokeless tobacco: Never Used  Vaping Use  . Vaping Use: Never used  Substance Use Topics  . Alcohol use: Not Currently    Comment: occassional  . Drug use: No    No current facility-administered medications for this encounter.  Current Outpatient Medications:  .  ondansetron (ZOFRAN ODT) 8 MG disintegrating tablet, Take 1 tablet (8 mg total) by mouth every 8 (eight) hours as needed for nausea., Disp: 12 tablet, Rfl: 0  No Known Allergies   ROS  As noted in  HPI.   Physical Exam  BP 121/79 (BP Location: Left Arm)   Pulse (!) 104   Temp 98.3 F (36.8 C) (Oral)   Resp 18   Ht 5\' 1"  (1.549 m)   Wt 52.6 kg   LMP 11/04/2019 (Approximate)   SpO2 100%   Breastfeeding No   BMI 21.92 kg/m   Constitutional: Well developed, well nourished, no acute distress.  Appears comfortable. Eyes:  EOMI, conjunctiva normal bilaterally HENT: Normocephalic, atraumatic,mucus membranes moist Respiratory: Normal inspiratory effort Cardiovascular: Mild regular tachycardia no murmurs rubs or gallops GI: nondistended soft.  Positive suprapubic tenderness.  No other abdominal tenderness.  No guarding, rebound. skin: No rash, skin intact Musculoskeletal: no deformities Neurologic: Alert & oriented x 3, no focal neuro deficits Psychiatric: Speech and behavior appropriate   ED Course   Medications - No data to display  Orders Placed This Encounter  Procedures  . Wet prep, genital    Standing Status:   Standing    Number of Occurrences:   1  . Urinalysis, Complete w Microscopic    Standing Status:   Standing    Number of Occurrences:   1  . Pregnancy, urine    Standing Status:   Standing    Number of Occurrences:   1    Results for orders placed or performed during the hospital encounter of 01/13/20 (from  the past 24 hour(s))  Urinalysis, Complete w Microscopic Urine, Clean Catch     Status: Abnormal   Collection Time: 01/13/20  6:34 PM  Result Value Ref Range   Color, Urine RED (A) YELLOW   APPearance HAZY (A) CLEAR   Specific Gravity, Urine 1.025 1.005 - 1.030   pH 6.5 5.0 - 8.0   Glucose, UA NEGATIVE NEGATIVE mg/dL   Hgb urine dipstick LARGE (A) NEGATIVE   Bilirubin Urine NEGATIVE NEGATIVE   Ketones, ur TRACE (A) NEGATIVE mg/dL   Protein, ur 161 (A) NEGATIVE mg/dL   Nitrite NEGATIVE NEGATIVE   Leukocytes,Ua NEGATIVE NEGATIVE   Squamous Epithelial / LPF 6-10 0 - 5   WBC, UA NONE SEEN 0 - 5 WBC/hpf   RBC / HPF >50 0 - 5 RBC/hpf   Bacteria,  UA FEW (A) NONE SEEN   Mucus PRESENT   Pregnancy, urine     Status: Abnormal   Collection Time: 01/13/20  6:34 PM  Result Value Ref Range   Preg Test, Ur POSITIVE (A) NEGATIVE  Wet prep, genital     Status: None   Collection Time: 01/13/20  6:34 PM   Specimen: Urine, Clean Catch  Result Value Ref Range   Yeast Wet Prep HPF POC NONE SEEN NONE SEEN   Trich, Wet Prep NONE SEEN NONE SEEN   Clue Cells Wet Prep HPF POC NONE SEEN NONE SEEN   WBC, Wet Prep HPF POC NONE SEEN NONE SEEN   Sperm NONE SEEN    No results found.  ED Clinical Impression  1. Lower abdominal pain   2. Vaginal bleeding   3. Positive pregnancy test      ED Assessment/Plan  Concern for threatened abortion, inevitable abortion, ectopic pregnancy.  Her blood pressure is normal although she is mildly tachycardic.  She appears well.  She states that her mother drove her here.  Sending to the MAU on [facility] in [place] for further evaluation.  Feel that she is stable to go by private vehicle.  Advised her to not have anything to eat or drink until her ER evaluation is complete in case she needs surgery.  She is comfortable, declined pain medication here.  Discussed labs, rationale for transfer to the MAU with patient.  She agrees with plan.  No orders of the defined types were placed in this encounter.   *This clinic note was created using Dragon dictation software. Therefore, there may be occasional mistakes despite careful proofreading.   ?    Domenick Gong, MD 01/13/20 1912

## 2020-01-13 NOTE — Discharge Instructions (Addendum)
Go to the MAU/women's emergency department on the [facility] campus in [place].  They specialize in OB/GYN problems.  We need to figure out where the pregnancy is located and to see if you are in the process of having a miscarriage.  Do not have anything to eat or drink until your ER evaluation is complete in case you need to go to surgery.  Let them know if your abdominal pain gets worse, your vaginal bleeding gets worse, or for any other concerns.

## 2020-01-13 NOTE — ED Triage Notes (Signed)
Patient in today c/o severe abdominal pain and cramps since 3pm today. Patient states she had a baby 02/2019. Patient states she has had two menstrual cycles since the birth of her child and the last one was August 20/21. Patient states that she felt a gush, like her water broke and then started bleeding and passing clots and clear mucous.

## 2020-01-13 NOTE — MAU Provider Note (Addendum)
History     CSN: [id number]  Arrival date and time: 01/13/20 1572   First Provider Initiated Contact with Patient 01/13/20 2030      Chief Complaint  Patient presents with  . Vaginal Bleeding   HPI Nancy Strong is a 29 y.o. [id number] at [redacted]w[redacted]d by LMP who presents with vaginal bleeding. Symptoms started today. Initially spotting but now dark red blood with small blood clots. Has some mid lower abdominal cramping as well. History of ectopic pregnancies x 2, both treated with methotrexate.   Location: abdomen Quality: cramping Severity: 5/10 on pain scale Duration: 1 day Timing: intermittent Modifying factors: none Associated signs and symptoms: vaginal bleeding.   OB History    Gravida  4   Para  1   Term  1   Preterm      AB  2   Living  1     SAB  0   TAB      Ectopic  2   Multiple      Live Births  1           Past Medical History:  Diagnosis Date  . History of ectopic pregnancy    x 2    Past Surgical History:  Procedure Laterality Date  . CESAREAN SECTION      Family History  Problem Relation Age of Onset  . Healthy Father   . Healthy Mother     Social History   Tobacco Use  . Smoking status: Never Smoker  . Smokeless tobacco: Never Used  Vaping Use  . Vaping Use: Never used  Substance Use Topics  . Alcohol use: Not Currently    Comment: occassional  . Drug use: No    Allergies: No Known Allergies  Medications Prior to Admission  Medication Sig Dispense Refill Last Dose  . ibuprofen (ADVIL) 600 MG tablet Take by mouth.     . ondansetron (ZOFRAN ODT) 8 MG disintegrating tablet Take 1 tablet (8 mg total) by mouth every 8 (eight) hours as needed for nausea. 12 tablet 0  at not taking    Review of Systems  Constitutional: Negative.   Gastrointestinal: Positive for abdominal pain.  Genitourinary: Positive for vaginal bleeding.   Physical Exam   Blood pressure 134/78, pulse (!) 109, temperature 98.2 F (36.8 C),  temperature source Oral, resp. rate 16, weight 52.8 kg, last menstrual period 11/04/2019, SpO2 100 %, not currently breastfeeding.  Physical Exam Vitals and nursing note reviewed. Exam conducted with a chaperone present.  Constitutional:      General: She is not in acute distress.    Appearance: Normal appearance.  HENT:     Head: Normocephalic and atraumatic.  Pulmonary:     Effort: Pulmonary effort is normal. No respiratory distress.  Abdominal:     General: There is no distension.     Palpations: Abdomen is soft.     Tenderness: There is no abdominal tenderness. There is no guarding.  Genitourinary:    General: Normal vulva.     Exam position: Lithotomy position.     Cervix: Cervical bleeding (small amount of dark red blood coming from os) present. No cervical motion tenderness, friability, lesion or erythema.     Comments: Cervix closed Neurological:     Mental Status: She is alert.     MAU Course  Procedures Results for orders placed or performed during the hospital encounter of 01/13/20 (from the past 24 hour(s))  Urinalysis, Complete w Microscopic Urine, Clean  Catch     Status: Abnormal   Collection Time: 01/13/20  6:34 PM  Result Value Ref Range   Color, Urine RED (A) YELLOW   APPearance HAZY (A) CLEAR   Specific Gravity, Urine 1.025 1.005 - 1.030   pH 6.5 5.0 - 8.0   Glucose, UA NEGATIVE NEGATIVE mg/dL   Hgb urine dipstick LARGE (A) NEGATIVE   Bilirubin Urine NEGATIVE NEGATIVE   Ketones, ur TRACE (A) NEGATIVE mg/dL   Protein, ur 785 (A) NEGATIVE mg/dL   Nitrite NEGATIVE NEGATIVE   Leukocytes,Ua NEGATIVE NEGATIVE   Squamous Epithelial / LPF 6-10 0 - 5   WBC, UA NONE SEEN 0 - 5 WBC/hpf   RBC / HPF >50 0 - 5 RBC/hpf   Bacteria, UA FEW (A) NONE SEEN   Mucus PRESENT   Pregnancy, urine     Status: Abnormal   Collection Time: 01/13/20  6:34 PM  Result Value Ref Range   Preg Test, Ur POSITIVE (A) NEGATIVE  Wet prep, genital     Status: None   Collection Time:  01/13/20  6:34 PM   Specimen: Urine, Clean Catch  Result Value Ref Range   Yeast Wet Prep HPF POC NONE SEEN NONE SEEN   Trich, Wet Prep NONE SEEN NONE SEEN   Clue Cells Wet Prep HPF POC NONE SEEN NONE SEEN   WBC, Wet Prep HPF POC NONE SEEN NONE SEEN   Sperm NONE SEEN    US OB LESS THAN 14 WEEKS WITH OB TRANSVAGINAL  Result Date: 01/13/2020 CLINICAL DATA:  Vaginal bleeding since intercourse yesterday. EXAM: OBSTETRIC <14 WK [place] AND TRANSVAGINAL OB US TECHNIQUE: Both transabdominal and transvaginal ultrasound examinations were performed for complete evaluation of the gestation as well as the maternal uterus, adnexal regions, and pelvic cul-de-sac. Transvaginal technique was performed to assess for early pregnancy. COMPARISON:  None. FINDINGS: Intrauterine gestational sac: None Yolk sac:  None Embryo:  None Maternal uterus/adnexae: Some thickening of the endometrium. Both ovaries appear normal with the right measuring 3.2 x 3.2 x 1.9 cm in the left measuring 2.9 x 1.7 x 2.0 cm. Normal appearing follicular cyst. No free fluid. IMPRESSION: No finding of intrauterine or ectopic pregnancy. Normal appearing uterus. Slightly prominent endometrium which may be physiologic. Normal appearing ovaries. Electronically Signed   By: Paulina Fusi M.D.   On: 01/13/2020 21:22    MDM +UPT UA, CBC, ABO/Rh, quant hCG, and [place] today to rule out ectopic pregnancy which can be life threatening.   RH negative. RHIG workup ordered & will plan to give rhogam before discharge  Ultrasound shows no IUP or adnexal mass. HCG pending  Care turned over to Steward Drone CNM Judeth Horn, NP  01/13/2020 9:32 PM   HCG results reviewed:  Results for orders placed or performed during the hospital encounter of 01/13/20 (from the past 24 hour(s))  Rh IG workup (includes ABO/Rh)     Status: None (Preliminary result)   Collection Time: 01/13/20  8:50 PM  Result Value Ref Range   Gestational Age(Wks) 10    ABO/RH(D) A NEG     Antibody Screen NEG    Unit Number [id number]    Blood Component Type RHIG    Unit division 00    Status of Unit ISSUED    Transfusion Status      OK TO TRANSFUSE Performed at [facility], [address] [place], [place] [address]   CBC     Status: None   Collection Time: 01/13/20  8:50 PM  Result Value Ref Range   WBC 10.0 4.0 - 10.5 K/uL   RBC 4.13 3.87 - 5.11 MIL/uL   Hemoglobin 12.3 12.0 - 15.0 g/dL   HCT 25.0 36 - 46 %   MCV 89.1 80.0 - 100.0 fL   MCH 29.8 26.0 - 34.0 pg   MCHC 33.4 30.0 - 36.0 g/dL   RDW 53.9 76.7 - 34.1 %   Platelets 226 150 - 400 K/uL   nRBC 0.0 0.0 - 0.2 %  hCG, quantitative, pregnancy     Status: Abnormal   Collection Time: 01/13/20  8:50 PM  Result Value Ref Range   hCG, Beta Chain, Quant, S 20,910 (H) <5 mIU/mL   Consult with Dr Vergie Living on HCG level and nothing seen on [place]- Dr Vergie Living recommends patient to receive MTX based on hx of ectopic x2 or close observation overnight in hospital and repeat labs in the morning.   Dr Vergie Living to bedside to discuss with patient - patient reports that she is not able to stay tonight and will return to MAU tomorrow night after she gets off work for repeat labs. Dr Vergie Living reviewed risks of ectopic pregnancy and rupture based on HCG level. Patient verbalizes understanding and reports that she will return tomorrow. Strict ectopic precautions reviewed.   Assessment and Plan   1. Encounter for assessment for suspected ectopic pregnancy   2. Abdominal pain during pregnancy in first trimester    Sharyon Cable, CNM 01/13/20, 11:55 PM

## 2020-01-14 ENCOUNTER — Inpatient Hospital Stay (HOSPITAL_COMMUNITY)
Admission: AD | Admit: 2020-01-14 | Discharge: 2020-01-15 | Disposition: A | Discharge status: Home / Self Care | Payer: Managed Care, Other (non HMO) | Attending: Obstetrics and Gynecology | Admitting: Obstetrics and Gynecology

## 2020-01-14 ENCOUNTER — Other Ambulatory Visit: Payer: Self-pay

## 2020-01-14 DIAGNOSIS — O3680X0 Pregnancy with inconclusive fetal viability, not applicable or unspecified: Secondary | ICD-10-CM | POA: Diagnosis present

## 2020-01-14 DIAGNOSIS — O039 Complete or unspecified spontaneous abortion without complication: Secondary | ICD-10-CM | POA: Diagnosis not present

## 2020-01-14 LAB — RH IG WORKUP (INCLUDES ABO/RH)
ABO/RH(D): A NEG
Antibody Screen: NEGATIVE
Gestational Age(Wks): 10
Unit division: 0

## 2020-01-14 NOTE — MAU Note (Signed)
Pt returns for follow up BHCG, denies pain, some light bleeding.

## 2020-01-15 DIAGNOSIS — O039 Complete or unspecified spontaneous abortion without complication: Secondary | ICD-10-CM | POA: Diagnosis not present

## 2020-01-15 LAB — CBC
HCT: 36.6 % (ref 36.0–46.0)
Hemoglobin: 12.2 g/dL (ref 12.0–15.0)
MCH: 30.3 pg (ref 26.0–34.0)
MCHC: 33.3 g/dL (ref 30.0–36.0)
MCV: 90.8 fL (ref 80.0–100.0)
Platelets: 217 10*3/uL (ref 150–400)
RBC: 4.03 MIL/uL (ref 3.87–5.11)
RDW: 12 % (ref 11.5–15.5)
WBC: 6.6 10*3/uL (ref 4.0–10.5)
nRBC: 0 % (ref 0.0–0.2)

## 2020-01-15 LAB — COMPREHENSIVE METABOLIC PANEL
ALT: 12 U/L (ref 0–44)
AST: 16 U/L (ref 15–41)
Albumin: 4.1 g/dL (ref 3.5–5.0)
Alkaline Phosphatase: 52 U/L (ref 38–126)
Anion gap: 8 (ref 5–15)
BUN: 10 mg/dL (ref 6–20)
CO2: 25 mmol/L (ref 22–32)
Calcium: 9.2 mg/dL (ref 8.9–10.3)
Chloride: 105 mmol/L (ref 98–111)
Creatinine, Ser: 0.85 mg/dL (ref 0.44–1.00)
GFR, Estimated: >60 mL/min (ref >60)
Glucose, Bld: 98 mg/dL (ref 70–99)
Potassium: 3.8 mmol/L (ref 3.5–5.1)
Sodium: 138 mmol/L (ref 135–145)
Total Bilirubin: 1.1 mg/dL (ref 0.3–1.2)
Total Protein: 7 g/dL (ref 6.5–8.1)

## 2020-01-15 LAB — HCG, QUANTITATIVE, PREGNANCY: hCG, Beta Chain, Quant, S: 7257 m[IU]/mL — ABNORMAL HIGH (ref <5)

## 2020-01-15 NOTE — Discharge Instructions (Signed)
Miscarriage A miscarriage is the loss of an unborn baby (fetus) before the 20th week of pregnancy. Follow these instructions at home: Medicines   Take over-the-counter and prescription medicines only as told by your doctor.  If you were prescribed antibiotic medicine, take it as told by your doctor. Do not stop taking the antibiotic even if you start to feel better.  Do not take NSAIDs unless your doctor says that this is safe for you. NSAIDs include aspirin and ibuprofen. These medicines can cause bleeding. Activity  Rest as directed. Ask your doctor what activities are safe for you.  Have someone help you at home during this time. General instructions  Write down how many pads you use each day and how soaked they are.  Watch the amount of tissue or clumps of blood (blood clots) that you pass from your vagina. Save any large amounts of tissue for your doctor.  Do not use tampons, douche, or have sex until your doctor approves.  To help you and your partner with the process of grieving, talk with your doctor or seek counseling.  When you are ready, meet with your doctor to talk about steps you should take for your health. Also, talk with your doctor about steps to take to have a healthy pregnancy in the future.  Keep all follow-up visits as told by your doctor. This is important. Contact a doctor if:  You have a fever or chills.  You have vaginal discharge that smells bad.  You have more bleeding. Get help right away if:  You have very bad cramps or pain in your back or belly.  You pass clumps of blood that are walnut-sized or larger from your vagina.  You pass tissue that is walnut-sized or larger from your vagina.  You soak more than 1 regular pad in an hour.  You get light-headed or weak.  You faint (pass out).  You have feelings of sadness that do not go away, or you have thoughts of hurting yourself. Summary  A miscarriage is the loss of an unborn baby before  the 20th week of pregnancy.  Follow your doctor's instructions for home care. Keep all follow-up appointments.  To help you and your partner with the process of grieving, talk with your doctor or seek counseling. This information is not intended to replace advice given to you by your health care provider. Make sure you discuss any questions you have with your health care provider. Document Revised: 06/15/2018 Document Reviewed: 03/29/2016 Elsevier Patient Education  2020 [employer]  

## 2020-01-15 NOTE — MAU Provider Note (Signed)
[name]  is a 29 y.o. [phone] at [redacted]w[redacted]d who presents to MAU today for follow-up quant hCG. Patient was seen in MAU yesterday for vaginal bleeding and abdominal pain. Patient was diagnosed with pregnancy of unknown location.   Patient reports that abdominal cramping has completely resolved this morning. Reports vaginal bleeding is now spotting, she denies passing clots.   BP 126/80 (BP Location: Right Arm)   Pulse 85   Temp 98.6 F (37 C)   Resp 16   LMP 11/04/2019 (Approximate)   SpO2 100%   CONSTITUTIONAL: Well-developed, well-nourished female in no acute distress.  ENT: External right and left ear normal.  EYES: EOM intact, conjunctivae normal.  MUSCULOSKELETAL: Normal range of motion.  CARDIOVASCULAR: Regular heart rate RESPIRATORY: Normal effort NEUROLOGICAL: Alert and oriented to person, place, and time.  SKIN: Skin is warm and dry. No rash noted. Not diaphoretic. No erythema. No pallor. PSYCH: Normal mood and affect. Normal behavior. Normal judgment and thought content.  HCG 11/8 was 20,910, today HCG is 7,257  Results for orders placed or performed during the hospital encounter of 01/14/20 (from the past 24 hour(s))  hCG, quantitative, pregnancy     Status: Abnormal   Collection Time: 01/14/20 11:38 PM  Result Value Ref Range   hCG, Beta Chain, Quant, S 7,257 (H) <5 mIU/mL  CBC     Status: None   Collection Time: 01/14/20 11:56 PM  Result Value Ref Range   WBC 6.6 4.0 - 10.5 K/uL   RBC 4.03 3.87 - 5.11 MIL/uL   Hemoglobin 12.2 12.0 - 15.0 g/dL   HCT 79.8 36 - 46 %   MCV 90.8 80.0 - 100.0 fL   MCH 30.3 26.0 - 34.0 pg   MCHC 33.3 30.0 - 36.0 g/dL   RDW 92.1 19.4 - 17.4 %   Platelets 217 150 - 400 K/uL   nRBC 0.0 0.0 - 0.2 %  Comprehensive metabolic panel     Status: None   Collection Time: 01/14/20 11:56 PM  Result Value Ref Range   Sodium 138 135 - 145 mmol/L   Potassium 3.8 3.5 - 5.1 mmol/L   Chloride 105 98 - 111 mmol/L   CO2 25 22 - 32 mmol/L    Glucose, Bld 98 70 - 99 mg/dL   BUN 10 6 - 20 mg/dL   Creatinine, Ser 0.81 0.44 - 1.00 mg/dL   Calcium 9.2 8.9 - 44.8 mg/dL   Total Protein 7.0 6.5 - 8.1 g/dL   Albumin 4.1 3.5 - 5.0 g/dL   AST 16 15 - 41 U/L   ALT 12 0 - 44 U/L   Alkaline Phosphatase 52 38 - 126 U/L   Total Bilirubin 1.1 0.3 - 1.2 mg/dL   GFR, Estimated >18 >56 mL/min   Anion gap 8 5 - 15   Discussed assessment and management with Dr Jolayne Panther. Okay to discharge home with close follow up. Recommends outpatient repeat HCG Monday or Tuesday and then weekly until HCG zero   Discussed plan of care with patient- patient verbalizes understanding and agrees. Precautions discussed and reasons to present to MAU. Pt stable at time of discharge.   A: 1. SAB (spontaneous abortion)    P: Discharge home Bleeding precautions discussed Follow up beta HCG on Monday or Tuesday in the office, patient lives in [place], will sent message to [facility] for scheduling.  Patient may return to MAU as needed or if her condition were to change or worsen  Sharyon Cable, CNM 01/15/2020 1:28 AM

## 2020-01-20 ENCOUNTER — Other Ambulatory Visit: Payer: Managed Care, Other (non HMO)

## 2020-01-21 ENCOUNTER — Telehealth: Payer: Self-pay | Admitting: Radiology

## 2020-01-21 NOTE — Telephone Encounter (Signed)
Left message for patient to call [facility] to scheduled Beta HCG for today 01/21/20

## 2020-01-22 ENCOUNTER — Telehealth: Payer: Self-pay | Admitting: Radiology

## 2020-01-22 NOTE — Telephone Encounter (Signed)
Patient returned call stating that she was not able to do lab draw during the week due to transportation and children, she needed a Saturday location. After speaking with lab and looking up location for [facility] for Saturday blood draw patient decided that she was just going to go to MAU for lab draw, No [facility] location available for times she needed. Rn notified and will contact MAU to inform of patient's intent to arrive this Saturday (11/20/521) for HCG.

## 2020-01-22 NOTE — Telephone Encounter (Signed)
Left message to call [facility] to schedule appointment for HCG lab

## 2020-01-25 ENCOUNTER — Other Ambulatory Visit: Payer: Self-pay

## 2020-01-25 ENCOUNTER — Inpatient Hospital Stay (HOSPITAL_COMMUNITY)
Admission: AD | Admit: 2020-01-25 | Discharge: 2020-01-25 | Disposition: A | Discharge status: Home / Self Care | Payer: Managed Care, Other (non HMO) | Attending: Obstetrics and Gynecology | Admitting: Obstetrics and Gynecology

## 2020-01-25 ENCOUNTER — Other Ambulatory Visit: Payer: Self-pay | Admitting: Women's Health

## 2020-01-25 DIAGNOSIS — O039 Complete or unspecified spontaneous abortion without complication: Secondary | ICD-10-CM

## 2020-01-25 DIAGNOSIS — Z3A11 11 weeks gestation of pregnancy: Secondary | ICD-10-CM

## 2020-01-25 LAB — HCG, QUANTITATIVE, PREGNANCY: hCG, Beta Chain, Quant, S: 314 m[IU]/mL — ABNORMAL HIGH (ref <5)

## 2020-01-25 NOTE — MAU Note (Signed)
Nancy Strong is a 29 y.o. at [redacted]w[redacted]d here in MAU reporting: states she is here for a follow up hcg post miscarriage. Denies pain and bleeding.  Pain score: 0/10  Vitals:   01/25/20 1632  BP: 128/79  Pulse: 71  Resp: 16  Temp: 97.9 F (36.6 C)  SpO2: 100%     Lab orders placed from triage: hcg

## 2020-01-25 NOTE — MAU Provider Note (Signed)
Subjective:  Nancy Strong is a 29 y.o. [phone] at [redacted]w[redacted]d who presents today for FU BHCG. She was seen on 01/13/2020. Results from that day show no IUP on [place], and HCG 20,910. Patient was then seen again on 01/14/2020 for hCG only and found that the quant dropped to 7,257 and was diagnosed with a miscarriage. Patient was supposed to be seen at [facility] for a repeat hCG, but patient reports she and her partner work Monday - Friday and are not abel to take days off from work and share one vehicle, which makes getting to appointments during normal business hours impossible. She denies vaginal bleeding. She denies abdominal or pelvic pain.   Objective:  Physical Exam  Nursing note and vitals reviewed.  Patient Vitals for the past 24 hrs:  BP Temp Temp src Pulse Resp SpO2 Height Weight  01/25/20 1632 128/79 97.9 F (36.6 C) Oral 71 16 100 % -- --  01/25/20 1630 -- -- -- -- -- -- 5\' 1"  (1.549 m) 52.6 kg   Constitutional: She is oriented to person, place, and time. She appears well-developed and well-nourished. No distress.  HENT:  Head: Normocephalic.  Cardiovascular: Normal rate.  Respiratory: Effort normal.  GI: Soft. There is no tenderness.  Neurological: She is alert and oriented to person, place, and time. Skin: Skin is warm and dry.  Psychiatric: She has a normal mood and affect.   Results for orders placed or performed during the hospital encounter of 01/25/20 (from the past 24 hour(s))  hCG, quantitative, pregnancy     Status: Abnormal   Collection Time: 01/25/20  4:34 PM  Result Value Ref Range   hCG, Beta Chain, Quant, S 314 (H) <5 mIU/mL    Assessment/Plan: SAB, following quant to zero per previous note HCG did drop appropriately FU in 2 weeks for: repeat hCG in MAU Patient reports she is open to a pregnancy at this time Added to beta book Return MAU precautions given Pt discharged to home in stable condition  Peace Noyes, 01/27/20, NP  5:42 PM 01/25/2020

## 2020-03-07 DIAGNOSIS — Z419 Encounter for procedure for purposes other than remedying health state, unspecified: Secondary | ICD-10-CM | POA: Diagnosis not present

## 2020-04-07 DIAGNOSIS — Z419 Encounter for procedure for purposes other than remedying health state, unspecified: Secondary | ICD-10-CM | POA: Diagnosis not present

## 2020-05-05 DIAGNOSIS — Z419 Encounter for procedure for purposes other than remedying health state, unspecified: Secondary | ICD-10-CM | POA: Diagnosis not present

## 2020-06-05 DIAGNOSIS — Z419 Encounter for procedure for purposes other than remedying health state, unspecified: Secondary | ICD-10-CM | POA: Diagnosis not present

## 2020-07-05 DIAGNOSIS — Z419 Encounter for procedure for purposes other than remedying health state, unspecified: Secondary | ICD-10-CM | POA: Diagnosis not present

## 2020-07-23 ENCOUNTER — Other Ambulatory Visit (HOSPITAL_COMMUNITY)
Admission: RE | Admit: 2020-07-23 | Discharge: 2020-07-23 | Disposition: A | Discharge status: Home / Self Care | Payer: Managed Care, Other (non HMO) | Source: Ambulatory Visit | Attending: Obstetrics and Gynecology | Admitting: Obstetrics and Gynecology

## 2020-07-23 ENCOUNTER — Ambulatory Visit (INDEPENDENT_AMBULATORY_CARE_PROVIDER_SITE_OTHER): Payer: Managed Care, Other (non HMO) | Admitting: Obstetrics and Gynecology

## 2020-07-23 ENCOUNTER — Other Ambulatory Visit: Payer: Self-pay

## 2020-07-23 ENCOUNTER — Encounter: Payer: Self-pay | Admitting: Obstetrics and Gynecology

## 2020-07-23 VITALS — BP 110/76 | HR 77 | Ht 61.0 in | Wt 110.0 lb

## 2020-07-23 DIAGNOSIS — Z01419 Encounter for gynecological examination (general) (routine) without abnormal findings: Secondary | ICD-10-CM | POA: Diagnosis not present

## 2020-07-23 DIAGNOSIS — Z124 Encounter for screening for malignant neoplasm of cervix: Secondary | ICD-10-CM

## 2020-07-23 NOTE — Addendum Note (Signed)
Addended by: Silvano Bilis on: 07/23/2020 09:08 AM   Modules accepted: Orders

## 2020-07-23 NOTE — Progress Notes (Signed)
HPI:      Ms. AYO GUARINO is a 30 y.o. [phone] who LMP was Patient's last menstrual period was 07/13/2020 (exact date).  Subjective:   She presents today for her annual examination.  She is having normal regular cycles.  She uses condoms for birth control.  She does state that her hemoglobin was somewhat low after her cesarean delivery and she would like it checked today. She describes history of breast cancer in her half sister.  No other first-degree relatives with breast cancer.    Hx: The following portions of the patient's history were reviewed and updated as appropriate:             She  has a past medical history of History of ectopic pregnancy. She does not have any pertinent problems on file. She  has a past surgical history that includes Cesarean section. Her family history includes Breast cancer in her maternal aunt and sister; Healthy in her mother; Hypertension in her father; Stroke in her maternal grandfather. She  reports that she has never smoked. She has never used smokeless tobacco. She reports previous alcohol use. She reports that she does not use drugs. She has a current medication list which includes the following prescription(s): ferrous sulfate. She has No Known Allergies.       Review of Systems:  Review of Systems  Constitutional: Denied constitutional symptoms, night sweats, recent illness, fatigue, fever, insomnia and weight loss.  Eyes: Denied eye symptoms, eye pain, photophobia, vision change and visual disturbance.  Ears/Nose/Throat/Neck: Denied ear, nose, throat or neck symptoms, hearing loss, nasal discharge, sinus congestion and sore throat.  Cardiovascular: Denied cardiovascular symptoms, arrhythmia, chest pain/pressure, edema, exercise intolerance, orthopnea and palpitations.  Respiratory: Denied pulmonary symptoms, asthma, pleuritic pain, productive sputum, cough, dyspnea and wheezing.  Gastrointestinal: Denied, gastro-esophageal reflux,  melena, nausea and vomiting.  Genitourinary: Denied genitourinary symptoms including symptomatic vaginal discharge, pelvic relaxation issues, and urinary complaints.  Musculoskeletal: Denied musculoskeletal symptoms, stiffness, swelling, muscle weakness and myalgia.  Dermatologic: Denied dermatology symptoms, rash and scar.  Neurologic: Denied neurology symptoms, dizziness, headache, neck pain and syncope.  Psychiatric: Denied psychiatric symptoms, anxiety and depression.  Endocrine: Denied endocrine symptoms including hot flashes and night sweats.   Meds:   Current Outpatient Medications on File Prior to Visit  Medication Sig Dispense Refill  . ferrous sulfate 325 (65 FE) MG EC tablet Take 325 mg by mouth 3 (three) times daily with meals.     No current facility-administered medications on file prior to visit.       The pregnancy intention screening data noted above was reviewed. Potential methods of contraception were discussed. The patient elected to proceed with Female Condom.     Objective:     Vitals:   07/23/20 0825  BP: 110/76  Pulse: 77    Filed Weights   07/23/20 0825  Weight: 110 lb (49.9 kg)              Physical examination General NAD, Conversant  HEENT Atraumatic; Op clear with mmm.  Normo-cephalic. Pupils reactive. Anicteric sclerae  Thyroid/Neck Smooth without nodularity or enlargement. Normal ROM.  Neck Supple.  Skin No rashes, lesions or ulceration. Normal palpated skin turgor. No nodularity.  Breasts: No masses or discharge.  Symmetric.  No axillary adenopathy.  Lungs: Clear to auscultation.No rales or wheezes. Normal Respiratory effort, no retractions.  Heart: NSR.  No murmurs or rubs appreciated. No periferal edema  Abdomen: Soft.  Non-tender.  No masses.  No HSM. No hernia  Extremities: Moves all appropriately.  Normal ROM for age. No lymphadenopathy.  Neuro: Oriented to PPT.  Normal mood. Normal affect.     Pelvic:   Vulva: Normal appearance.  No  lesions.  Vagina: No lesions or abnormalities noted.  Support: Normal pelvic support.  Urethra No masses tenderness or scarring.  Meatus Normal size without lesions or prolapse.  Cervix: Normal appearance.  No lesions.  Anus: Normal exam.  No lesions.  Perineum: Normal exam.  No lesions.        Bimanual   Uterus: Normal size.  Non-tender.  Mobile.  AV.  Adnexae: No masses.  Non-tender to palpation.  Cul-de-sac: Negative for abnormality.     Assessment:    [id number] Patient Active Problem List   Diagnosis Date Noted  . 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 Well woman exam with routine gynecological exam     Normal exam.   Plan:            1.  Basic Screening Recommendations The basic screening recommendations for asymptomatic women were discussed with the patient during her visit.  The age-appropriate recommendations were discussed with her and the rational for the tests reviewed.  When I am informed by the patient that another primary care physician has previously obtained the age-appropriate tests and they are up-to-date, only outstanding tests are ordered and referrals given as necessary.  Abnormal results of tests will be discussed with her when all of her results are completed.  Routine preventative health maintenance measures emphasized: Exercise/Diet/Weight control, Tobacco Warnings, Alcohol/Substance use risks and Stress Management Pap performed Orders Orders Placed This Encounter  Procedures  . CBC With Diff/Platelet    No orders of the defined types were placed in this encounter.         F/U  No follow-ups on file.  Elonda Husky, M.D. 07/23/2020 8:45 AM

## 2020-07-24 LAB — CBC WITH DIFF/PLATELET
Basophils Absolute: 0 10*3/uL (ref 0.0–0.2)
Basos: 1 %
EOS (ABSOLUTE): 0.1 10*3/uL (ref 0.0–0.4)
Eos: 3 %
Hematocrit: 36.1 % (ref 34.0–46.6)
Hemoglobin: 12.3 g/dL (ref 11.1–15.9)
Immature Grans (Abs): 0 10*3/uL (ref 0.0–0.1)
Immature Granulocytes: 0 %
Lymphocytes Absolute: 1.5 10*3/uL (ref 0.7–3.1)
Lymphs: 34 %
MCH: 30 pg (ref 26.6–33.0)
MCHC: 34.1 g/dL (ref 31.5–35.7)
MCV: 88 fL (ref 79–97)
Monocytes Absolute: 0.3 10*3/uL (ref 0.1–0.9)
Monocytes: 7 %
Neutrophils Absolute: 2.4 10*3/uL (ref 1.4–7.0)
Neutrophils: 55 %
Platelets: 189 10*3/uL (ref 150–450)
RBC: 4.1 x10E6/uL (ref 3.77–5.28)
RDW: 11.7 % (ref 11.7–15.4)
WBC: 4.4 10*3/uL (ref 3.4–10.8)

## 2020-07-28 LAB — CYTOLOGY - PAP
Adequacy: ABSENT
Comment: NEGATIVE
Diagnosis: UNDETERMINED — AB
High risk HPV: NEGATIVE

## 2020-08-05 DIAGNOSIS — Z419 Encounter for procedure for purposes other than remedying health state, unspecified: Secondary | ICD-10-CM | POA: Diagnosis not present

## 2020-09-04 DIAGNOSIS — Z419 Encounter for procedure for purposes other than remedying health state, unspecified: Secondary | ICD-10-CM | POA: Diagnosis not present

## 2020-10-05 DIAGNOSIS — Z419 Encounter for procedure for purposes other than remedying health state, unspecified: Secondary | ICD-10-CM | POA: Diagnosis not present

## 2020-11-05 DIAGNOSIS — Z419 Encounter for procedure for purposes other than remedying health state, unspecified: Secondary | ICD-10-CM | POA: Diagnosis not present

## 2020-12-05 DIAGNOSIS — Z419 Encounter for procedure for purposes other than remedying health state, unspecified: Secondary | ICD-10-CM | POA: Diagnosis not present

## 2020-12-14 ENCOUNTER — Ambulatory Visit (INDEPENDENT_AMBULATORY_CARE_PROVIDER_SITE_OTHER): Payer: Managed Care, Other (non HMO) | Admitting: Obstetrics and Gynecology

## 2020-12-14 ENCOUNTER — Other Ambulatory Visit: Payer: Self-pay

## 2020-12-14 VITALS — BP 97/65 | HR 92 | Ht 61.0 in | Wt 114.1 lb

## 2020-12-14 DIAGNOSIS — Z202 Contact with and (suspected) exposure to infections with a predominantly sexual mode of transmission: Secondary | ICD-10-CM | POA: Diagnosis not present

## 2020-12-14 DIAGNOSIS — Z8759 Personal history of other complications of pregnancy, childbirth and the puerperium: Secondary | ICD-10-CM

## 2020-12-14 DIAGNOSIS — O219 Vomiting of pregnancy, unspecified: Secondary | ICD-10-CM

## 2020-12-14 DIAGNOSIS — Z3481 Encounter for supervision of other normal pregnancy, first trimester: Secondary | ICD-10-CM | POA: Diagnosis not present

## 2020-12-14 DIAGNOSIS — Z719 Counseling, unspecified: Secondary | ICD-10-CM

## 2020-12-14 DIAGNOSIS — O09299 Supervision of pregnancy with other poor reproductive or obstetric history, unspecified trimester: Secondary | ICD-10-CM

## 2020-12-14 DIAGNOSIS — R001 Bradycardia, unspecified: Secondary | ICD-10-CM | POA: Diagnosis not present

## 2020-12-14 LAB — OB RESULTS CONSOLE VARICELLA ZOSTER ANTIBODY, IGG: Varicella: IMMUNE

## 2020-12-14 MED ORDER — DOXYLAMINE-PYRIDOXINE 10-10 MG PO TBEC: 10.0000 mg | DELAYED_RELEASE_TABLET | Freq: Every day | ORAL | 1 refills | Status: DC

## 2020-12-14 NOTE — Progress Notes (Signed)
Debe Coder presents for NOB nurse interview visit. Pregnancy confirmation done _At [facility] ER DEPT- 11/30/2020.  G- 5.  [id number]. SIUP at 6 weeks on scan at [facility]. [facility] and  embryonic heart rate of 95, which is low (bradycardia). Per [facility] U/S scheduled for this week. Pregnancy education material explained and given. _0__ cats in the home. NOB labs ordered. Sickle cell ordered due to race. HIV labs and Drug screen were explained and ordered. PNV encouraged. Genetic screening options discussed. Genetic testing: to be done at [facility].   Pt may discuss with provider.  Pap smear 07/23/20 ascus/neg. Flu vaccine due at 16 weeks.  Pos for nausea. Diclegis erxed. Education given. Pt states she has had wiped x 2 last week with a slight pink d/c. NO cramping. NO bright red blood. Pt does have a hx of miscarriage and ectopic.  Pt advised to monitor. Pt. To follow up with provider in 3  weeks for NOB physical.  1 week for f/u scan. All questions answered.

## 2020-12-14 NOTE — Patient Instructions (Signed)
WHAT OB PATIENTS CAN EXPECT  Confirmation of pregnancy and ultrasound ordered if medically indicated-[redacted] weeks gestation New OB (NOB) intake with nurse and New OB (NOB) labs- [redacted] weeks gestation New OB (NOB) physical examination with provider- 11/[redacted] weeks gestation Flu vaccine-[redacted] weeks gestation Anatomy scan-[redacted] weeks gestation Glucose tolerance test, blood work to test for anemia, T-dap vaccine-[redacted] weeks gestation Vaginal swabs/cultures-STD/Group B strep-[redacted] weeks gestation Appointments every 4 weeks until 28 weeks Every 2 weeks from 28 weeks until 36 weeks Weekly visits from 36 weeks until delivery  Morning Sickness Morning sickness is when you feel like you may vomit (feel nauseous) during pregnancy. Sometimes, you may vomit. Morning sickness most often happens in the morning, but it can also happen at any time of the day. Some women may have morning sickness that makes them vomit all the time. This is a more serious problem that needs treatment. What are the causes? The cause of this condition is not known. What increases the risk? You had vomiting or a feeling like you may vomit before your pregnancy. You had morning sickness in another pregnancy. You are pregnant with more than one baby, such as twins. What are the signs or symptoms? Feeling like you may vomit. Vomiting. How is this treated? Treatment is usually not needed for this condition. You may only need to change what you eat. In some cases, your doctor may give you some things to take for your condition. These include: Vitamin B6 supplements. Medicines to treat the feeling that you may vomit. Ginger. Follow these instructions at home: Medicines Take over-the-counter and prescription medicines only as told by your doctor. Do not take any medicines until you talk with your doctor about them first. Take multivitamins before you get pregnant. These can stop or lessen the symptoms of morning sickness. Eating and drinking Eat dry  toast or crackers before getting out of bed. Eat 5 or 6 small meals a day. Eat dry and bland foods like rice and baked potatoes. Do not eat greasy, fatty, or spicy foods. Have someone cook for you if the smell of food causes you to vomit or to feel like you may vomit. If you feel like you may vomit after taking prenatal vitamins, take them at night or with a snack. Eat protein foods when you need a snack. Nuts, yogurt, and cheese are good choices. Drink fluids throughout the day. Try ginger ale made with real ginger, ginger tea made from fresh grated ginger, or ginger candies. General instructions Do not smoke or use any products that contain nicotine or tobacco. If you need help quitting, ask your doctor. Use an air purifier to keep the air in your house free of smells. Get lots of fresh air. Try to avoid smells that make you feel sick. Try wearing an acupressure wristband. This is a wristband that is used to treat seasickness. Try a treatment called acupuncture. In this treatment, a doctor puts needles into certain areas of your body to make you feel better. Contact a doctor if: You need medicine to feel better. You feel dizzy or light-headed. You are losing weight. Get help right away if: The feeling that you may vomit will not go away, or you cannot stop vomiting. You faint. You have very bad pain in your belly. Summary Morning sickness is when you feel like you may vomit (feel nauseous) during pregnancy. You may feel sick in the morning, but you can feel this way at any time of the day. Making some  changes to what you eat may help your symptoms go away. This information is not intended to replace advice given to you by your health care provider. Make sure you discuss any questions you have with your health care provider. Document Revised: 10/07/2019 Document Reviewed: 09/16/2019 Elsevier Patient Education  2022 [employer]. How a Baby Grows During Pregnancy Pregnancy begins  when a female's sperm enters a female's egg. This is called fertilization. Fertilization usually happens in one of the fallopian tubes that connect the ovaries to the uterus. The fertilized egg moves down the fallopian tube to the uterus. Once it reaches the uterus, it implants into the lining of the uterus and begins to grow. For the first 8 weeks, the fertilized egg is called an embryo. After 8 weeks, it is called a fetus. As the fetus continues to grow, it receives oxygen and nutrients through the placenta, which is an organ that grows to support the developing baby. The placenta is the life support system for the baby. It provides oxygen and nutrition and removes waste. How long does a typical pregnancy last? A pregnancy usually lasts 280 days, or about 40 weeks. Pregnancy is divided into three periods of growth, also called trimesters: First trimester: 0-12 weeks. Second trimester: 13-27 weeks. Third trimester: 28-40 weeks. The day when your baby is ready to be born (full term) is your estimated date of delivery. However, most babies are not born on their estimated date of delivery. How does my baby develop month by month? First month The fertilized egg attaches to the inside of the uterus. Some cells will form the placenta. Others will form the fetus. The arms, legs, brain, spinal cord, lungs, and heart begin to develop. At the end of the first month, the heart begins to beat. Second month The bones, inner ear, eyelids, hands, and feet form. The genitals develop. By the end of 8 weeks, all major organs are developing. Third month All of the internal organs are forming. Teeth develop below the gums. Bones and muscles begin to grow. The spine can flex. The skin is transparent. Fingernails and toenails begin to form. Arms and legs continue to grow longer, and hands and feet develop. The fetus is about 3 inches (7.6 cm) long. Fourth month The placenta is completely formed. The external  sex organs, neck, outer ear, eyebrows, eyelids, and fingernails are formed. The fetus can hear, swallow, and move its arms and legs. The kidneys begin to produce urine. The skin is covered with a white, waxy coating (vernix) and very fine hair (lanugo). Fifth month The fetus moves around more and can be felt for the first time (quickening). The fetus starts to sleep and wake up and may begin to suck a finger. The nails grow to the end of the fingers. The organ in the digestive system that makes bile (gallbladder) functions and helps to digest nutrients. If the fetus is a female, eggs are present in the ovaries. If the fetus is a female, testicles start to move down into the scrotum. Sixth month The lungs are formed. The eyes open. The brain continues to develop. Your baby has fingerprints and toe prints. Your baby's hair grows thicker. At the end of the second trimester, the fetus is about 9 inches (22.9 cm) long. Seventh month The fetus kicks and stretches. The eyes are developed enough to sense changes in light. The hands can make a grasping motion. The fetus responds to sound. Eighth month Most organs and body systems are  fully developed and functioning. Bones harden, and taste buds develop. The fetus may hiccup. Certain areas of the brain are still developing. The skull remains soft. Ninth month The fetus gains about  lb (0.23 kg) each week. The lungs are fully developed. Patterns of sleep develop. The fetus's head typically moves into a head-down position (vertex) in the uterus to prepare for birth. The fetus weighs 6-9 lb (2.72-4.08 kg) and is 19-20 inches (48.26-50.8 cm) long. How do I know if my baby is developing well? Always talk with your health care provider about any concerns that you may have about your pregnancy and your baby. At each prenatal visit, your health care provider will do several different tests to check on your health and keep track of your baby's  development. These include: Fundal height and position. To do this, your health care provider will: Measure your growing belly from your pubic bone to the top of the uterus using a tape measure. Feel your belly to determine your baby's position. Heartbeat. An ultrasound in the first trimester can confirm pregnancy and show a heartbeat, depending on how far along you are. Your health care provider will check your baby's heart rate at every prenatal visit. You will also have a second trimester ultrasound to check your baby's development. Follow these instructions at home: Take prenatal vitamins as told by your health care provider. These include vitamins such as folic acid, iron, calcium, and vitamin D. They are important for healthy development. Take over-the-counter and prescription medicines only as told by your health care provider. Keep all follow-up visits. This is important. Follow-up visits include prenatal care and screening tests. Summary A pregnancy usually lasts 280 days, or about 40 weeks. Pregnancy is divided into three periods of growth, also called trimesters. Your health care provider will monitor your baby's growth and development throughout your pregnancy. Follow your health care provider's recommendations about taking prenatal vitamins and medicines during your pregnancy. Talk with your health care provider if you have any concerns about your pregnancy or your developing baby. This information is not intended to replace advice given to you by your health care provider. Make sure you discuss any questions you have with your health care provider. Document Revised: 07/31/2019 Document Reviewed: 06/06/2019 Elsevier Patient Education  2022 [place] of Pregnancy The first trimester of pregnancy starts on the first day of your last menstrual period until the end of week 12. This is months 1 through 3 of pregnancy. A week after a sperm fertilizes an egg, the egg  will implant into the wall of the uterus and begin to develop into a baby. By the end of 12 weeks, all the baby's organs will be formed and the baby will be 2-3 inches in size. Body changes during your first trimester Your body goes through many changes during pregnancy. The changes vary and generally return to normal after your baby is born. Physical changes You may gain or lose weight. Your breasts may begin to grow larger and become tender. The tissue that surrounds your nipples (areola) may become darker. Dark spots or blotches (chloasma or mask of pregnancy) may develop on your face. You may have changes in your hair. These can include thickening or thinning of your hair or changes in texture. Health changes You may feel nauseous, and you may vomit. You may have heartburn. You may develop headaches. You may develop constipation. Your gums may bleed and may be sensitive to brushing and flossing. Other changes  You may tire easily. You may urinate more often. Your menstrual periods will stop. You may have a loss of appetite. You may develop cravings for certain kinds of food. You may have changes in your emotions from day to day. You may have more vivid and strange dreams. Follow these instructions at home: Medicines Follow your health care provider's instructions regarding medicine use. Specific medicines may be either safe or unsafe to take during pregnancy. Do not take any medicines unless told to by your health care provider. Take a prenatal vitamin that contains at least 600 micrograms (mcg) of folic acid. Eating and drinking Eat a healthy diet that includes fresh fruits and vegetables, whole grains, good sources of protein such as meat, eggs, or tofu, and low-fat dairy products. Avoid raw meat and unpasteurized juice, milk, and cheese. These carry germs that can harm you and your baby. If you feel nauseous or you vomit: Eat 4 or 5 small meals a day instead of 3 large meals. Try  eating a few soda crackers. Drink liquids between meals instead of during meals. You may need to take these actions to prevent or treat constipation: Drink enough fluid to keep your urine pale yellow. Eat foods that are high in fiber, such as beans, whole grains, and fresh fruits and vegetables. Limit foods that are high in fat and processed sugars, such as fried or sweet foods. Activity Exercise only as directed by your health care provider. Most people can continue their usual exercise routine during pregnancy. Try to exercise for 30 minutes at least 5 days a week. Stop exercising if you develop pain or cramping in the lower abdomen or lower back. Avoid exercising if it is very hot or humid or if you are at high altitude. Avoid heavy lifting. If you choose to, you may have sex unless your health care provider tells you not to. Relieving pain and discomfort Wear a good support bra to relieve breast tenderness. Rest with your legs elevated if you have leg cramps or low back pain. If you develop bulging veins (varicose veins) in your legs: Wear support hose as told by your health care provider. Elevate your feet for 15 minutes, 3-4 times a day. Limit salt in your diet. Safety Wear your seat belt at all times when driving or riding in a car. Talk with your health care provider if someone is verbally or physically abusive to you. Talk with your health care provider if you are feeling sad or have thoughts of hurting yourself. Lifestyle Do not use hot tubs, steam rooms, or saunas. Do not douche. Do not use tampons or scented sanitary pads. Do not use herbal remedies, alcohol, illegal drugs, or medicines that are not approved by your health care provider. Chemicals in these products can harm your baby. Do not use any products that contain nicotine or tobacco, such as cigarettes, e-cigarettes, and chewing tobacco. If you need help quitting, ask your health care provider. Avoid cat litter boxes  and soil used by cats. These carry germs that can cause birth defects in the baby and possibly loss of the unborn baby (fetus) by miscarriage or stillbirth. General instructions During routine prenatal visits in the first trimester, your health care provider will do a physical exam, perform necessary tests, and ask you how things are going. Keep all follow-up visits. This is important. Ask for help if you have counseling or nutritional needs during pregnancy. Your health care provider can offer advice or refer you to specialists  for help with various needs. Schedule a dentist appointment. At home, brush your teeth with a soft toothbrush. Floss gently. Write down your questions. Take them to your prenatal visits. Where to find more information American Pregnancy Association: americanpregnancy.[place] and Gynecologists: PoolDevices.com.pt Office on [employer]: KeywordPortfolios.com.br Contact a health care provider if you have: Dizziness. A fever. Mild pelvic cramps, pelvic pressure, or nagging pain in the abdominal area. Nausea, vomiting, or diarrhea that lasts for 24 hours or longer. A bad-smelling vaginal discharge. Pain when you urinate. Known exposure to a contagious illness, such as chickenpox, measles, Zika virus, HIV, or hepatitis. Get help right away if you have: Spotting or bleeding from your vagina. Severe abdominal cramping or pain. Shortness of breath or chest pain. Any kind of trauma, such as from a fall or a car crash. New or increased pain, swelling, or redness in an arm or leg. Summary The first trimester of pregnancy starts on the first day of your last menstrual period until the end of week 12 (months 1 through 3). Eating 4 or 5 small meals a day rather than 3 large meals may help to relieve nausea and vomiting. Do not use any products that contain nicotine or tobacco, such as cigarettes, e-cigarettes, and chewing  tobacco. If you need help quitting, ask your health care provider. Keep all follow-up visits. This is important. This information is not intended to replace advice given to you by your health care provider. Make sure you discuss any questions you have with your health care provider. Document Revised: 07/31/2019 Document Reviewed: 06/06/2019 Elsevier Patient Education  [place]. Common Medications Safe in Pregnancy  Acne:      Constipation:  Benzoyl Peroxide     Colace  Clindamycin      Dulcolax Suppository  Topica Erythromycin     Fibercon  Salicylic Acid      Metamucil         Miralax AVOID:        Senakot   Accutane    Cough:  Retin-A       Cough Drops  Tetracycline      Phenergan w/ Codeine if Rx  Minocycline      Robitussin (Plain & DM)  Antibiotics:     Crabs/Lice:  Ceclor       RID  Cephalosporins    AVOID:  E-Mycins      Kwell  Keflex  Macrobid/Macrodantin   Diarrhea:  Penicillin      Kao-Pectate  Zithromax      Imodium AD         PUSH FLUIDS AVOID:       Cipro     Fever:  Tetracycline      Tylenol (Regular or Extra  Minocycline       Strength)  Levaquin      Extra Strength-Do not          Exceed 8 tabs/24 hrs Caffeine:        <29m/day (equiv. To 1 cup of coffee or  approx. 3 12 oz sodas)         Gas: Cold/Hayfever:       Gas-X  Benadryl      Mylicon  Claritin       Phazyme  **Claritin-D        Chlor-Trimeton    Headaches:  Dimetapp      ASA-Free Excedrin  Drixoral-Non-Drowsy     Cold Compress  Mucinex (Guaifenasin)     Tylenol (  Regular or Extra  Sudafed/Sudafed-12 Hour     Strength)  **Sudafed PE Pseudoephedrine   Tylenol Cold & Sinus     Vicks Vapor Rub  Zyrtec  **AVOID if Problems With Blood Pressure         Heartburn: Avoid lying down for at least 1 hour after meals  Aciphex      Maalox     Rash:  Milk of Magnesia     Benadryl    Mylanta       1% Hydrocortisone Cream  Pepcid  Pepcid Complete   Sleep  Aids:  Prevacid      Ambien   Prilosec       Benadryl  Rolaids       Chamomile Tea  Tums (Limit 4/day)     Unisom         Tylenol PM         Warm milk-add vanilla or  Hemorrhoids:       Sugar for taste  Anusol/Anusol H.C.  (RX: Analapram 2.5%)  Sugar Substitutes:  Hydrocortisone OTC     Ok in moderation  Preparation H      Tucks        Vaseline lotion applied to tissue with wiping    Herpes:     Throat:  Acyclovir      Oragel  Famvir  Valtrex     Vaccines:         Flu Shot Leg Cramps:       *Gardasil  Benadryl      Hepatitis A         Hepatitis B Nasal Spray:       Pneumovax  Saline Nasal Spray     Polio Booster         Tetanus Nausea:       Tuberculosis test or PPD  Vitamin B6 25 mg TID   AVOID:    Dramamine      *Gardasil  Emetrol       Live Poliovirus  Ginger Root 250 mg QID    MMR (measles, mumps &  High Complex Carbs @ Bedtime    rebella)  Sea Bands-Accupressure    Varicella (Chickenpox)  Unisom 1/2 tab TID     *No known complications           If received before Pain:         Known pregnancy;   Darvocet       Resume series after  Lortab        Delivery  Percocet    Yeast:   Tramadol      Femstat  Tylenol 3      Gyne-lotrimin  Ultram       Monistat  Vicodin           MISC:         All Sunscreens           Hair Coloring/highlights          Insect Repellant's          (Including DEET)         [facility]  [place], [place], [place] [address]  Phone: [phone]  [facility] (second location)  [address] [place], [place] [address]  Phone: [phone]  [facility] [place], [place], [place] [address] Phone: [phone]  [address] [place], [place] [address]  Phone: [phone]

## 2020-12-15 LAB — URINALYSIS, ROUTINE W REFLEX MICROSCOPIC
Bilirubin, UA: NEGATIVE
Glucose, UA: NEGATIVE
Ketones, UA: NEGATIVE
Leukocytes,UA: NEGATIVE
Nitrite, UA: NEGATIVE
RBC, UA: NEGATIVE
Specific Gravity, UA: 1.023 (ref 1.005–1.030)
Urobilinogen, Ur: 0.2 mg/dL (ref 0.2–1.0)
pH, UA: 6.5 (ref 5.0–7.5)

## 2020-12-15 LAB — ANTIBODY SCREEN: Antibody Screen: NEGATIVE

## 2020-12-15 LAB — VIRAL HEPATITIS HBV, HCV
HCV Ab: <0.1 s/co ratio (ref 0.0–0.9)
Hep B Core Total Ab: NEGATIVE
Hep B Surface Ab, Qual: REACTIVE
Hepatitis B Surface Ag: NEGATIVE

## 2020-12-15 LAB — ABO AND RH: Rh Factor: NEGATIVE

## 2020-12-15 LAB — RPR: RPR Ser Ql: NONREACTIVE

## 2020-12-15 LAB — RUBELLA SCREEN: Rubella Antibodies, IGG: 3.33 index (ref >0.99)

## 2020-12-15 LAB — VARICELLA ZOSTER ANTIBODY, IGG: Varicella zoster IgG: 2690 index (ref >165)

## 2020-12-15 LAB — HIV ANTIBODY (ROUTINE TESTING W REFLEX): HIV Screen 4th Generation wRfx: NONREACTIVE

## 2020-12-15 LAB — HCV INTERPRETATION

## 2020-12-15 LAB — HGB SOLU + RFLX FRAC: Sickle Solubility Test - HGBRFX: NEGATIVE

## 2020-12-16 LAB — PAIN MGT SCRN (14 DRUGS), UR
Amphetamine Scrn, Ur: NEGATIVE ng/mL
BARBITURATE SCREEN URINE: NEGATIVE ng/mL
BENZODIAZEPINE SCREEN, URINE: NEGATIVE ng/mL
Buprenorphine, Urine: NEGATIVE ng/mL
CANNABINOIDS UR QL SCN: NEGATIVE ng/mL
Cocaine (Metab) Scrn, Ur: NEGATIVE ng/mL
Creatinine(Crt), U: 145.3 mg/dL (ref 20.0–300.0)
Fentanyl, Urine: NEGATIVE pg/mL
Meperidine Screen, Urine: NEGATIVE ng/mL
Methadone Screen, Urine: NEGATIVE ng/mL
OXYCODONE+OXYMORPHONE UR QL SCN: NEGATIVE ng/mL
Opiate Scrn, Ur: NEGATIVE ng/mL
Ph of Urine: 6.5 (ref 4.5–8.9)
Phencyclidine Qn, Ur: NEGATIVE ng/mL
Propoxyphene Scrn, Ur: NEGATIVE ng/mL
Tramadol Screen, Urine: NEGATIVE ng/mL

## 2020-12-16 LAB — CULTURE, OB URINE

## 2020-12-16 LAB — NICOTINE SCREEN, URINE: Cotinine Ql Scrn, Ur: NEGATIVE ng/mL

## 2020-12-16 LAB — URINE CULTURE, OB REFLEX

## 2020-12-18 LAB — GC/CHLAMYDIA PROBE AMP
Chlamydia trachomatis, NAA: NEGATIVE
Neisseria Gonorrhoeae by PCR: NEGATIVE

## 2020-12-21 ENCOUNTER — Other Ambulatory Visit: Payer: Self-pay

## 2020-12-21 ENCOUNTER — Encounter: Payer: Self-pay | Admitting: Obstetrics and Gynecology

## 2020-12-21 ENCOUNTER — Ambulatory Visit (INDEPENDENT_AMBULATORY_CARE_PROVIDER_SITE_OTHER): Payer: Managed Care, Other (non HMO)

## 2020-12-21 DIAGNOSIS — Z3481 Encounter for supervision of other normal pregnancy, first trimester: Secondary | ICD-10-CM

## 2020-12-21 DIAGNOSIS — R001 Bradycardia, unspecified: Secondary | ICD-10-CM | POA: Diagnosis not present

## 2021-01-05 DIAGNOSIS — Z419 Encounter for procedure for purposes other than remedying health state, unspecified: Secondary | ICD-10-CM | POA: Diagnosis not present

## 2021-01-08 ENCOUNTER — Encounter: Payer: Managed Care, Other (non HMO) | Admitting: Obstetrics and Gynecology

## 2021-01-19 ENCOUNTER — Encounter: Payer: Managed Care, Other (non HMO) | Admitting: Obstetrics and Gynecology

## 2021-01-27 ENCOUNTER — Telehealth: Payer: Self-pay | Admitting: Obstetrics and Gynecology

## 2021-01-27 NOTE — Telephone Encounter (Signed)
Pt is calling in stating that she has body aches and pain and would like to know what she can take.  She stated that she will look for her safety paperwork for what she can and can not take.  Pt would like to have a call back.

## 2021-01-27 NOTE — Telephone Encounter (Signed)
Spoke with patient. Advised her to follow the safe medication in pregnancy list and treat symptoms accordingly. She verbalized understanding.

## 2021-02-03 ENCOUNTER — Telehealth: Payer: Self-pay | Admitting: Obstetrics and Gynecology

## 2021-02-03 NOTE — Telephone Encounter (Signed)
Called pt to let her know that we rec'd ADA (Americans with Disabilities Act Accommodation Request Form - [employer])) form to be filled out.  LMOM for pt to let her that we would need for her to come by or call us so that she can pay the form fee of $25.00 so that we are able to get the form started.  Form placed in the front drawer waiting on the payment.

## 2021-02-04 ENCOUNTER — Other Ambulatory Visit: Payer: Self-pay

## 2021-02-04 ENCOUNTER — Encounter: Payer: Self-pay | Admitting: Obstetrics and Gynecology

## 2021-02-04 ENCOUNTER — Ambulatory Visit (INDEPENDENT_AMBULATORY_CARE_PROVIDER_SITE_OTHER): Payer: Managed Care, Other (non HMO) | Admitting: Obstetrics and Gynecology

## 2021-02-04 VITALS — BP 119/82 | HR 108 | Wt 122.7 lb

## 2021-02-04 DIAGNOSIS — Z1379 Encounter for other screening for genetic and chromosomal anomalies: Secondary | ICD-10-CM

## 2021-02-04 DIAGNOSIS — Z419 Encounter for procedure for purposes other than remedying health state, unspecified: Secondary | ICD-10-CM | POA: Diagnosis not present

## 2021-02-04 DIAGNOSIS — Z3A15 15 weeks gestation of pregnancy: Secondary | ICD-10-CM

## 2021-02-04 DIAGNOSIS — Z3481 Encounter for supervision of other normal pregnancy, first trimester: Secondary | ICD-10-CM

## 2021-02-04 LAB — POCT URINALYSIS DIPSTICK OB
Bilirubin, UA: NEGATIVE
Blood, UA: NEGATIVE
Glucose, UA: NEGATIVE
Ketones, UA: NEGATIVE
Leukocytes, UA: NEGATIVE
Nitrite, UA: NEGATIVE
POC,PROTEIN,UA: NEGATIVE
Spec Grav, UA: 1.02 (ref 1.010–1.025)
Urobilinogen, UA: 0.2 E.U./dL
pH, UA: 6.5 (ref 5.0–8.0)

## 2021-02-04 NOTE — Progress Notes (Signed)
New OB Physical.  Patient states she has continued to be nauseas, patient states she has not been taking any medications to ease.  Patient states she has been getting up for short walks while working at home for comfort and to ease her round ligament pains.

## 2021-02-04 NOTE — Progress Notes (Signed)
NOB: Very excited about being pregnant!!  Taking vitamins and iron as directed.  Desires MaterniT 21 and AFP drawn in 2 weeks.  No complaints today.  Physical examination General NAD, Conversant  HEENT Atraumatic; Op clear with mmm.  Normo-cephalic. Pupils reactive. Anicteric sclerae  Thyroid/Neck Smooth without nodularity or enlargement. Normal ROM.  Neck Supple.  Skin No rashes, lesions or ulceration. Normal palpated skin turgor. No nodularity.  Breasts: No masses or discharge.  Symmetric.  No axillary adenopathy.  Lungs: Clear to auscultation.No rales or wheezes. Normal Respiratory effort, no retractions.  Heart: NSR.  No murmurs or rubs appreciated. No periferal edema  Abdomen: Soft.  Non-tender.  No masses.  No HSM. No hernia  Extremities: Moves all appropriately.  Normal ROM for age. No lymphadenopathy.  Neuro: Oriented to PPT.  Normal mood. Normal affect.     Pelvic:   Vulva: Normal appearance.  No lesions.  Vagina: No lesions or abnormalities noted.  Support: Normal pelvic support.  Urethra No masses tenderness or scarring.  Meatus Normal size without lesions or prolapse.  Cervix: Normal appearance.  No lesions.  Anus: Normal exam.  No lesions.  Perineum: Normal exam.  No lesions.        Bimanual   Adnexae: No masses.  Non-tender to palpation.  Uterus: Enlarged.  15 weeks fetal heart tones 155 non-tender.  Mobile.  AV.  Adnexae: No masses.  Non-tender to palpation.  Cul-de-sac: Negative for abnormality.  Adnexae: No masses.  Non-tender to palpation.         Pelvimetry   Diagonal: Reached.  Spines: Average.  Sacrum: Concave.  Pubic Arch: Normal.

## 2021-02-04 NOTE — Addendum Note (Signed)
Addended by: Lady Deutscher on: 02/04/2021 09:54 AM   Modules accepted: Orders

## 2021-02-18 ENCOUNTER — Other Ambulatory Visit: Payer: Managed Care, Other (non HMO)

## 2021-03-04 ENCOUNTER — Ambulatory Visit (INDEPENDENT_AMBULATORY_CARE_PROVIDER_SITE_OTHER): Payer: Managed Care, Other (non HMO) | Admitting: Obstetrics and Gynecology

## 2021-03-04 ENCOUNTER — Encounter: Payer: Self-pay | Admitting: Obstetrics and Gynecology

## 2021-03-04 ENCOUNTER — Other Ambulatory Visit: Payer: Self-pay

## 2021-03-04 VITALS — BP 106/52 | HR 114 | Wt 129.2 lb

## 2021-03-04 DIAGNOSIS — Z6791 Unspecified blood type, Rh negative: Secondary | ICD-10-CM

## 2021-03-04 DIAGNOSIS — Z98891 History of uterine scar from previous surgery: Secondary | ICD-10-CM

## 2021-03-04 DIAGNOSIS — O26899 Other specified pregnancy related conditions, unspecified trimester: Secondary | ICD-10-CM | POA: Insufficient documentation

## 2021-03-04 DIAGNOSIS — Z3A19 19 weeks gestation of pregnancy: Secondary | ICD-10-CM

## 2021-03-04 DIAGNOSIS — Z8759 Personal history of other complications of pregnancy, childbirth and the puerperium: Secondary | ICD-10-CM | POA: Insufficient documentation

## 2021-03-04 DIAGNOSIS — Z3481 Encounter for supervision of other normal pregnancy, first trimester: Secondary | ICD-10-CM

## 2021-03-04 LAB — POCT URINALYSIS DIPSTICK OB
Bilirubin, UA: NEGATIVE
Blood, UA: NEGATIVE
Glucose, UA: NEGATIVE
Ketones, UA: NEGATIVE
Leukocytes, UA: NEGATIVE
Nitrite, UA: NEGATIVE
Spec Grav, UA: 1.01 (ref 1.010–1.025)
Urobilinogen, UA: 0.2 E.U./dL
pH, UA: 8 (ref 5.0–8.0)

## 2021-03-04 NOTE — Progress Notes (Signed)
ROB: Doing well no complaints. Discussed monitoring fetal growth during current pregnancy due to h/o IUGR. Anatomy scan scheduled. Declines genetic testing. Desires to hold on flu vaccine today, may consider next visit. Plans to breastfeed. H/o C/S, would like trial of labor if possible. RTC in 4 weeks.

## 2021-03-04 NOTE — Progress Notes (Signed)
ROB: She is doing well, no new concerns today. ?

## 2021-03-07 DIAGNOSIS — Z419 Encounter for procedure for purposes other than remedying health state, unspecified: Secondary | ICD-10-CM | POA: Diagnosis not present

## 2021-03-07 NOTE — L&D Delivery Note (Signed)
Delivery Summary for Nancy Strong  Labor Events:   Preterm labor: No data found  Rupture date: No data found  Rupture time: No data found  Rupture type: No data found  Fluid Color: Clear Pink  Induction: No data found  Augmentation: No data found  Complications: No data found  Cervical ripening: No data found No data found   No data found     Delivery:   Episiotomy: No data found  Lacerations: No data found  Repair suture: No data found  Repair # of packets: No data found  Blood loss (ml): No data found   Information for the patient's newborn:  Nancy, Strong [[id number]]   Delivery 07/24/2021 6:21 PM by  C-Section, Low Transverse Sex:  female Gestational Age: [redacted]w[redacted]d Delivery Clinician:   Living?:         APGARS  One minute Five minutes Ten minutes  Skin color:        Heart rate:        Grimace:        Muscle tone:        Breathing:        Totals: 8  9      Presentation/position:      Resuscitation:   Cord information:    Disposition of cord blood:     Blood gases sent?  Complications:   Placenta: Delivered:       appearance Newborn Measurements: Weight: 7 lb 9 oz (3430 g)  Height: 19.29"  Head circumference:    Chest circumference:    Other providers:    Additional  information: Forceps:   Vacuum:   Breech:   Observed anomalies       Please see Dr. Oretha Milch operative note for details of C-section procedure.   Hildred Laser, MD [facility]

## 2021-03-10 ENCOUNTER — Telehealth: Payer: Self-pay | Admitting: Obstetrics and Gynecology

## 2021-03-10 NOTE — Telephone Encounter (Signed)
Pt called is asking about follow up on FMLA. Please Advise.

## 2021-03-12 NOTE — Telephone Encounter (Signed)
Pt is calling in to check the status of her FMLA pw and stated that she needs it asap due to her job is asking her for it and it was rec'd by our office on 02/05/2021 and she paid for it on 02/15/2021.  Pt would like to pick it up the pw on 03/16/2021 at her appointment @ 1:00 pm.

## 2021-03-16 ENCOUNTER — Other Ambulatory Visit: Payer: Self-pay

## 2021-03-16 ENCOUNTER — Ambulatory Visit (INDEPENDENT_AMBULATORY_CARE_PROVIDER_SITE_OTHER): Payer: Managed Care, Other (non HMO)

## 2021-03-16 DIAGNOSIS — Z3482 Encounter for supervision of other normal pregnancy, second trimester: Secondary | ICD-10-CM | POA: Diagnosis not present

## 2021-03-16 DIAGNOSIS — Z3A15 15 weeks gestation of pregnancy: Secondary | ICD-10-CM | POA: Diagnosis not present

## 2021-03-18 ENCOUNTER — Telehealth: Payer: Self-pay

## 2021-03-18 NOTE — Telephone Encounter (Signed)
Pt informed of gestational age via Mychart.

## 2021-03-19 NOTE — Telephone Encounter (Signed)
Pt to forward new set of paperwork via MyChart to be filled out again.

## 2021-03-31 ENCOUNTER — Telehealth: Payer: Self-pay

## 2021-03-31 ENCOUNTER — Encounter: Payer: Managed Care, Other (non HMO) | Admitting: Obstetrics and Gynecology

## 2021-03-31 ENCOUNTER — Telehealth: Payer: Self-pay | Admitting: Obstetrics and Gynecology

## 2021-03-31 NOTE — Telephone Encounter (Signed)
Pt called asking about her FMLA paperwork- made her aware that our part was completed and that there is a section that she needs to finish filling out before submitted.Pt requested we fax to [phone] I made her aware that she did not need to fax back to [place] just go ahead and send into her provider or the group handling her FMLA. Faxed on 03-31-2021 @ 1520 with confirmation.

## 2021-04-01 ENCOUNTER — Encounter: Payer: Managed Care, Other (non HMO) | Admitting: Obstetrics and Gynecology

## 2021-04-01 MED ORDER — FLUCONAZOLE 150 MG PO TABS: 150.0000 mg | ORAL_TABLET | Freq: Once | ORAL | 3 refills | Status: AC

## 2021-04-01 NOTE — Telephone Encounter (Signed)
I'll send in a prescription.  If it does not work, then we can perform a vaginal swab at her appointment.   Dr. Marcelline Mates

## 2021-04-07 ENCOUNTER — Other Ambulatory Visit: Payer: Self-pay

## 2021-04-07 ENCOUNTER — Ambulatory Visit (INDEPENDENT_AMBULATORY_CARE_PROVIDER_SITE_OTHER): Payer: Managed Care, Other (non HMO) | Admitting: Obstetrics and Gynecology

## 2021-04-07 ENCOUNTER — Encounter: Payer: Self-pay | Admitting: Obstetrics and Gynecology

## 2021-04-07 VITALS — BP 103/69 | HR 114 | Wt 132.0 lb

## 2021-04-07 DIAGNOSIS — Z3482 Encounter for supervision of other normal pregnancy, second trimester: Secondary | ICD-10-CM

## 2021-04-07 DIAGNOSIS — Z419 Encounter for procedure for purposes other than remedying health state, unspecified: Secondary | ICD-10-CM | POA: Diagnosis not present

## 2021-04-07 DIAGNOSIS — Z3A24 24 weeks gestation of pregnancy: Secondary | ICD-10-CM

## 2021-04-07 LAB — POCT URINALYSIS DIPSTICK OB
Bilirubin, UA: NEGATIVE
Blood, UA: NEGATIVE
Glucose, UA: NEGATIVE
Ketones, UA: NEGATIVE
Leukocytes, UA: NEGATIVE
Nitrite, UA: NEGATIVE
POC,PROTEIN,UA: NEGATIVE
Spec Grav, UA: 1.01 (ref 1.010–1.025)
Urobilinogen, UA: 0.2 E.U./dL
pH, UA: 7 (ref 5.0–8.0)

## 2021-04-07 NOTE — Progress Notes (Signed)
ROB. Patient has questions about tingling in vaginal area and the levels of her amniotic fluid since her last ultrasound. Patient also states no itching, discharge and no burning since calling in last week with yeast infection symptoms. Patient states no other questions or concerns.

## 2021-04-07 NOTE — Progress Notes (Signed)
ROB: Patient is doing well.  She reports she has occasional "tingling" in the vaginal area mostly when she has to urinate.  She states she is dealing with it and is not a huge issue.  She used Monistat for yeast infection and this has cleared up.  Reports no vaginal discharge itching or burning.  Reports daily fetal movement.  1 hour GCT next visit.

## 2021-05-05 ENCOUNTER — Encounter: Payer: Self-pay | Admitting: Obstetrics and Gynecology

## 2021-05-05 ENCOUNTER — Ambulatory Visit (INDEPENDENT_AMBULATORY_CARE_PROVIDER_SITE_OTHER): Payer: Managed Care, Other (non HMO) | Admitting: Obstetrics and Gynecology

## 2021-05-05 ENCOUNTER — Other Ambulatory Visit: Payer: Managed Care, Other (non HMO)

## 2021-05-05 ENCOUNTER — Other Ambulatory Visit: Payer: Self-pay

## 2021-05-05 VITALS — BP 104/63 | HR 105 | Wt 133.1 lb

## 2021-05-05 DIAGNOSIS — Z3483 Encounter for supervision of other normal pregnancy, third trimester: Secondary | ICD-10-CM

## 2021-05-05 DIAGNOSIS — Z23 Encounter for immunization: Secondary | ICD-10-CM

## 2021-05-05 DIAGNOSIS — Z6791 Unspecified blood type, Rh negative: Secondary | ICD-10-CM

## 2021-05-05 DIAGNOSIS — O26893 Other specified pregnancy related conditions, third trimester: Secondary | ICD-10-CM

## 2021-05-05 DIAGNOSIS — O26899 Other specified pregnancy related conditions, unspecified trimester: Secondary | ICD-10-CM

## 2021-05-05 DIAGNOSIS — Z113 Encounter for screening for infections with a predominantly sexual mode of transmission: Secondary | ICD-10-CM

## 2021-05-05 DIAGNOSIS — Z3A28 28 weeks gestation of pregnancy: Secondary | ICD-10-CM

## 2021-05-05 DIAGNOSIS — Z8759 Personal history of other complications of pregnancy, childbirth and the puerperium: Secondary | ICD-10-CM

## 2021-05-05 DIAGNOSIS — Z131 Encounter for screening for diabetes mellitus: Secondary | ICD-10-CM

## 2021-05-05 DIAGNOSIS — O34219 Maternal care for unspecified type scar from previous cesarean delivery: Secondary | ICD-10-CM

## 2021-05-05 DIAGNOSIS — Z419 Encounter for procedure for purposes other than remedying health state, unspecified: Secondary | ICD-10-CM | POA: Diagnosis not present

## 2021-05-05 LAB — POCT URINALYSIS DIPSTICK OB
Bilirubin, UA: NEGATIVE
Glucose, UA: NEGATIVE
Ketones, UA: NEGATIVE
Nitrite, UA: NEGATIVE
POC,PROTEIN,UA: NEGATIVE
Spec Grav, UA: 1.01 (ref 1.010–1.025)
Urobilinogen, UA: 0.2 E.U./dL
pH, UA: 7 (ref 5.0–8.0)

## 2021-05-05 MED ORDER — RHO D IMMUNE GLOBULIN 1500 UNIT/2ML IJ SOSY
300.0000 ug | PREFILLED_SYRINGE | Freq: Once | INTRAMUSCULAR | Status: AC
  Administered 2021-05-05: 300 ug via INTRAMUSCULAR

## 2021-05-05 NOTE — Progress Notes (Signed)
ROB: Doing well, no issues. For 28 week labs today.  Plans to breastfeed, desires  contraceptive patch  for contraception. For Tdap today, signed blood consent.  Rhogam administered. Will plan for growth scan starting at 32 weeks for h/o IUGR in prior pregnancy. Discussed TOLAC vs repeat C/S, desires TOLAC.  Counseled regarding TOLAC vs RCS; risks/benefits discussed in detail. All questions answered.  Patient elects for TOLAC, consent signed 05/05/2021. RTC in 2 weeks.  ? ? ? ? ?

## 2021-05-05 NOTE — Patient Instructions (Signed)
Vaginal Birth After Cesarean Delivery °Vaginal birth after cesarean delivery (VBAC) means giving birth vaginally after previously delivering a baby through a cesarean section, or C-section. VBAC may be a safe option for you, depending on your health and other factors. °Discuss VBAC with your health care provider early in your pregnancy, so you can understand the risks, benefits, and options. Having these discussions early will give you time to make your birth plan. °What increases the chances for a successful VBAC? °These factors increase your chances of a successful VBAC: °You have had only one previous C-section. °You had a low transverse incision for your C-section. °You have had a successful vaginal birth. °Your labor starts naturally on or before your due date. °You and your baby have had a healthy pregnancy. °Your baby is head-down. °What happens when I arrive at the birth center or hospital? °Once you are in labor and have been admitted into the hospital or birth center, your health care team may: °Review your pregnancy history and go over any concerns you have. °Talk with you about your birth plan and discuss pain control options. °Check your blood pressure, breathing rate, and heart rate. °Check your baby's heartbeat. °Monitor your uterus for contractions. °Check whether your bag of water (amniotic sac) has broken (ruptured). °Insert an IV into one of your veins. You may get fluids and medicine through the IV. °Monitoring and exams °Your health care team may check your contractions (uterine monitoring) and your baby's heart rate (fetal monitoring). You may need to be monitored for long periods at a time (continuously) with a monitoring device. °Your health care provider may also do frequent physical exams. These may include checking how and where your baby is positioned in your uterus and checking your cervix to determine whether it is opening up (dilating) or thinning out (effacing). °What happens during  labor and delivery? °Normal labor and delivery are divided into the following three stages: °Stage 1 °This is the longest stage of labor. °Throughout this stage, you will feel contractions. Contractions are generally mild, infrequent, and irregular at first. They get stronger, more frequent (about every 2-3 minutes), and more regular as you move through this stage. °The first stage ends when your cervix is completely dilated to 4 inches (10 cm) and effaced. °Stage 2 °This stage starts once your cervix is completely dilated and effaced and lasts until you deliver your baby. °In this stage, you will feel the urge to push your baby out of your vagina. °You may feel stretching and burning pain, especially when the widest part of your baby's head passes through the vaginal opening (crowning). °Once your baby is delivered, the umbilical cord will be clamped and cut. °Your baby will be placed on your bare chest and covered with a warm blanket. If you are planning to breastfeed, watch your baby for feeding cues, like rooting or sucking. °Stage 3 °This stage starts immediately after your baby is born and ends after you deliver the placenta. °This stage may take anywhere from 5 to 30 minutes. °What can I expect after labor and delivery? °After labor is over, you and your baby will be checked to make sure you are both healthy, and you will both be monitored until you are ready to go home. °You may be encouraged to stay in the same room with your baby to promote early bonding and successful breastfeeding. °Your uterus will be checked and massaged regularly (fundal massage). °You may continue to receive fluids and medicines through   an IV. °You will have some soreness and pain in your abdomen, vagina, and the area of skin between your vaginal opening and your anus (perineum). °If an incision was made near your vagina (episiotomy) or if you had some vaginal tearing during delivery, cold compresses may be used to reduce pain and  swelling. °Follow these instructions at home: °Incision care °If you have an episiotomy incision, follow instructions from your health care provider about how to take care of your incision. °Check your incision area every day for signs of infection. Check for: °Redness, swelling, or pain. °More fluid or blood. °Warmth. °Pus or a bad smell. °If directed, put ice on the episiotomy area. To do this: °Put ice in a plastic bag. °Place a towel between your skin and the bag. °Leave the ice on for 20 minutes, 2-3 times a day. °Remove the ice if your skin turns bright red. This is very important. If you cannot feel pain, heat, or cold, you have a greater risk of damage to the area. °Activity °Return to your normal activities as told by your health care provider. Ask your health care provider what activities are safe for you. °Avoid sitting for a long time without moving. Get up to take short walks every 1-2 hours. °General instructions °Take over-the-counter and prescription medicines only as told by your health care provider. °Do not take baths, swim, or use a hot tub until your health care provider approves. Ask if you may take showers. You may only be allowed to take sponge baths. °It is normal to have vaginal bleeding after delivery. Wear a sanitary pad for vaginal bleeding and discharge. °Keep all follow-up visits. This is important. °Where to find more information °American Pregnancy Association: americanpregnancy.org °American College of Obstetricians and Gynecologists: acog.org °Summary °Vaginal birth after cesarean delivery (VBAC) means giving birth vaginally after previously delivering a baby through a cesarean section, or C-section. °Once you are in labor and have been admitted into the hospital or birth center, your health care team may review your pregnancy history and go over any concerns you have. °Although most women are able to have a successful VBAC, sometimes it is necessary to have another  C-section. °Keep all follow-up visits. This is important. °This information is not intended to replace advice given to you by your health care provider. Make sure you discuss any questions you have with your health care provider. °Document Revised: 02/20/2020 Document Reviewed: 02/20/2020 °Elsevier Patient Education © 2022 Elsevier Inc. ° °

## 2021-05-05 NOTE — Progress Notes (Signed)
ROB: She is doing well. Glucose, BTC, TDAP and Rhogam done today. ?

## 2021-05-06 LAB — CBC
Hematocrit: 30 % — ABNORMAL LOW (ref 34.0–46.6)
Hemoglobin: 10 g/dL — ABNORMAL LOW (ref 11.1–15.9)
MCH: 28 pg (ref 26.6–33.0)
MCHC: 33.3 g/dL (ref 31.5–35.7)
MCV: 84 fL (ref 79–97)
Platelets: 204 10*3/uL (ref 150–450)
RBC: 3.57 x10E6/uL — ABNORMAL LOW (ref 3.77–5.28)
RDW: 12.8 % (ref 11.7–15.4)
WBC: 9.4 10*3/uL (ref 3.4–10.8)

## 2021-05-06 LAB — GLUCOSE, 1 HOUR GESTATIONAL: Gestational Diabetes Screen: 100 mg/dL (ref 70–139)

## 2021-05-06 LAB — RPR: RPR Ser Ql: NONREACTIVE

## 2021-05-19 ENCOUNTER — Ambulatory Visit (INDEPENDENT_AMBULATORY_CARE_PROVIDER_SITE_OTHER): Payer: Managed Care, Other (non HMO) | Admitting: Obstetrics and Gynecology

## 2021-05-19 ENCOUNTER — Encounter: Payer: Self-pay | Admitting: Obstetrics and Gynecology

## 2021-05-19 ENCOUNTER — Other Ambulatory Visit: Payer: Self-pay

## 2021-05-19 VITALS — BP 106/68 | HR 98 | Wt 139.1 lb

## 2021-05-19 DIAGNOSIS — Z3483 Encounter for supervision of other normal pregnancy, third trimester: Secondary | ICD-10-CM

## 2021-05-19 DIAGNOSIS — Z3A30 30 weeks gestation of pregnancy: Secondary | ICD-10-CM

## 2021-05-19 LAB — POCT URINALYSIS DIPSTICK OB
Bilirubin, UA: NEGATIVE
Blood, UA: NEGATIVE
Glucose, UA: NEGATIVE
Ketones, UA: NEGATIVE
Nitrite, UA: NEGATIVE
Spec Grav, UA: 1.015 (ref 1.010–1.025)
Urobilinogen, UA: 0.2 E.U./dL
pH, UA: 7 (ref 5.0–8.0)

## 2021-05-19 NOTE — Progress Notes (Signed)
ROB: Occasional right-sided discomfort.  Denies contractions.  Reports daily active fetal movement.  Generally doing well. ?

## 2021-05-19 NOTE — Progress Notes (Signed)
ROB. Patient states right sided pain, discussed belly bands. Patient states no questions or concerns at this time.     ?

## 2021-06-02 ENCOUNTER — Encounter: Payer: Managed Care, Other (non HMO) | Admitting: Obstetrics and Gynecology

## 2021-06-02 ENCOUNTER — Other Ambulatory Visit: Payer: Managed Care, Other (non HMO)

## 2021-06-05 DIAGNOSIS — Z419 Encounter for procedure for purposes other than remedying health state, unspecified: Secondary | ICD-10-CM | POA: Diagnosis not present

## 2021-06-07 ENCOUNTER — Ambulatory Visit (INDEPENDENT_AMBULATORY_CARE_PROVIDER_SITE_OTHER): Payer: Managed Care, Other (non HMO)

## 2021-06-07 DIAGNOSIS — Z3A32 32 weeks gestation of pregnancy: Secondary | ICD-10-CM

## 2021-06-07 DIAGNOSIS — Z8759 Personal history of other complications of pregnancy, childbirth and the puerperium: Secondary | ICD-10-CM

## 2021-06-07 DIAGNOSIS — O34219 Maternal care for unspecified type scar from previous cesarean delivery: Secondary | ICD-10-CM | POA: Diagnosis not present

## 2021-06-17 ENCOUNTER — Ambulatory Visit (INDEPENDENT_AMBULATORY_CARE_PROVIDER_SITE_OTHER): Payer: Managed Care, Other (non HMO) | Admitting: Obstetrics and Gynecology

## 2021-06-17 ENCOUNTER — Encounter: Payer: Self-pay | Admitting: Obstetrics and Gynecology

## 2021-06-17 VITALS — BP 104/57 | HR 101 | Wt 139.8 lb

## 2021-06-17 DIAGNOSIS — O26893 Other specified pregnancy related conditions, third trimester: Secondary | ICD-10-CM

## 2021-06-17 DIAGNOSIS — O34219 Maternal care for unspecified type scar from previous cesarean delivery: Secondary | ICD-10-CM

## 2021-06-17 DIAGNOSIS — Z8759 Personal history of other complications of pregnancy, childbirth and the puerperium: Secondary | ICD-10-CM

## 2021-06-17 DIAGNOSIS — Z3483 Encounter for supervision of other normal pregnancy, third trimester: Secondary | ICD-10-CM

## 2021-06-17 DIAGNOSIS — R102 Pelvic and perineal pain: Secondary | ICD-10-CM

## 2021-06-17 DIAGNOSIS — Z3A34 34 weeks gestation of pregnancy: Secondary | ICD-10-CM

## 2021-06-17 LAB — POCT URINALYSIS DIPSTICK OB
Bilirubin, UA: NEGATIVE
Blood, UA: NEGATIVE
Glucose, UA: NEGATIVE
Ketones, UA: NEGATIVE
Leukocytes, UA: NEGATIVE
Nitrite, UA: NEGATIVE
Spec Grav, UA: 1.015 (ref 1.010–1.025)
Urobilinogen, UA: 0.2 E.U./dL
pH, UA: 7 (ref 5.0–8.0)

## 2021-06-17 NOTE — Progress Notes (Signed)
ROB: feels more pelvic pressure. S/p growth scan for h/o IUGR, growth 40%ile, normal AFI.  Consider repeat around 36-37 weeks.  Discussed previous labor and FTP. Will attempt cervical ripening agents and discussed different methods of IOL if she requires it again. Otherwise hopeful for spontaneous labor. RTC in 2 weeks.  ?

## 2021-06-17 NOTE — Progress Notes (Signed)
ROB: She is doing well, no new concerns. 

## 2021-07-01 ENCOUNTER — Encounter: Payer: Managed Care, Other (non HMO) | Admitting: Obstetrics and Gynecology

## 2021-07-01 DIAGNOSIS — Z3685 Encounter for antenatal screening for Streptococcus B: Secondary | ICD-10-CM

## 2021-07-01 DIAGNOSIS — Z3A36 36 weeks gestation of pregnancy: Secondary | ICD-10-CM

## 2021-07-01 DIAGNOSIS — Z113 Encounter for screening for infections with a predominantly sexual mode of transmission: Secondary | ICD-10-CM

## 2021-07-01 DIAGNOSIS — Z3483 Encounter for supervision of other normal pregnancy, third trimester: Secondary | ICD-10-CM

## 2021-07-05 DIAGNOSIS — Z419 Encounter for procedure for purposes other than remedying health state, unspecified: Secondary | ICD-10-CM | POA: Diagnosis not present

## 2021-07-07 ENCOUNTER — Ambulatory Visit (INDEPENDENT_AMBULATORY_CARE_PROVIDER_SITE_OTHER): Payer: Managed Care, Other (non HMO)

## 2021-07-07 ENCOUNTER — Encounter: Payer: Self-pay | Admitting: Obstetrics and Gynecology

## 2021-07-07 ENCOUNTER — Ambulatory Visit (INDEPENDENT_AMBULATORY_CARE_PROVIDER_SITE_OTHER): Payer: Managed Care, Other (non HMO) | Admitting: Obstetrics and Gynecology

## 2021-07-07 VITALS — BP 112/69 | HR 113 | Wt 140.4 lb

## 2021-07-07 DIAGNOSIS — Z8759 Personal history of other complications of pregnancy, childbirth and the puerperium: Secondary | ICD-10-CM

## 2021-07-07 DIAGNOSIS — O09293 Supervision of pregnancy with other poor reproductive or obstetric history, third trimester: Secondary | ICD-10-CM

## 2021-07-07 DIAGNOSIS — Z3685 Encounter for antenatal screening for Streptococcus B: Secondary | ICD-10-CM

## 2021-07-07 DIAGNOSIS — Z3A36 36 weeks gestation of pregnancy: Secondary | ICD-10-CM | POA: Diagnosis not present

## 2021-07-07 DIAGNOSIS — Z113 Encounter for screening for infections with a predominantly sexual mode of transmission: Secondary | ICD-10-CM

## 2021-07-07 DIAGNOSIS — Z3483 Encounter for supervision of other normal pregnancy, third trimester: Secondary | ICD-10-CM

## 2021-07-07 DIAGNOSIS — O34219 Maternal care for unspecified type scar from previous cesarean delivery: Secondary | ICD-10-CM

## 2021-07-07 DIAGNOSIS — Z3A37 37 weeks gestation of pregnancy: Secondary | ICD-10-CM

## 2021-07-07 LAB — POCT URINALYSIS DIPSTICK OB
Bilirubin, UA: NEGATIVE
Blood, UA: NEGATIVE
Glucose, UA: NEGATIVE
Ketones, UA: NEGATIVE
Leukocytes, UA: NEGATIVE
Nitrite, UA: NEGATIVE
POC,PROTEIN,UA: NEGATIVE
Spec Grav, UA: 1.015 (ref 1.010–1.025)
Urobilinogen, UA: 0.2 E.U./dL
pH, UA: 6.5 (ref 5.0–8.0)

## 2021-07-07 NOTE — Progress Notes (Signed)
ROB:She is doing well, has no new concerns. 

## 2021-07-07 NOTE — Progress Notes (Signed)
ROB: Noting more pressure and more [employer]. Discussed labor precautions. 36 week cultures done today. Growth scan for h/o IUGR in prior pregnancy normal today, growth 36%ile. RTC in 1 week.  ?

## 2021-07-08 LAB — OB RESULTS CONSOLE GC/CHLAMYDIA: Chlamydia: NEGATIVE

## 2021-07-10 LAB — GC/CHLAMYDIA PROBE AMP
Chlamydia trachomatis, NAA: NEGATIVE
Neisseria Gonorrhoeae by PCR: NEGATIVE

## 2021-07-12 LAB — CULTURE, BETA STREP (GROUP B ONLY): Strep Gp B Culture: NEGATIVE

## 2021-07-15 ENCOUNTER — Ambulatory Visit (INDEPENDENT_AMBULATORY_CARE_PROVIDER_SITE_OTHER): Payer: Managed Care, Other (non HMO) | Admitting: Obstetrics and Gynecology

## 2021-07-15 ENCOUNTER — Encounter: Payer: Self-pay | Admitting: Obstetrics and Gynecology

## 2021-07-15 VITALS — Wt 148.2 lb

## 2021-07-15 DIAGNOSIS — Z3A38 38 weeks gestation of pregnancy: Secondary | ICD-10-CM

## 2021-07-15 DIAGNOSIS — Z3483 Encounter for supervision of other normal pregnancy, third trimester: Secondary | ICD-10-CM

## 2021-07-15 LAB — POCT URINALYSIS DIPSTICK OB
Bilirubin, UA: NEGATIVE
Blood, UA: NEGATIVE
Glucose, UA: NEGATIVE
Ketones, UA: NEGATIVE
Leukocytes, UA: NEGATIVE
Nitrite, UA: NEGATIVE
POC,PROTEIN,UA: NEGATIVE
Spec Grav, UA: 1.015 (ref 1.010–1.025)
Urobilinogen, UA: 0.2 E.U./dL
pH, UA: 7 (ref 5.0–8.0)

## 2021-07-15 NOTE — Progress Notes (Signed)
ROB: No complaints.  Has Braxton Hicks contractions but not strong yet.  Reports daily fetal movement. ?

## 2021-07-15 NOTE — Progress Notes (Signed)
ROB. Patient states fetal movement with increased pelvic pressure. She states starting raspberry leaf tea, increased cramping. Patient states no questions or concerns at this time.  ? ?

## 2021-07-23 ENCOUNTER — Encounter: Payer: Self-pay | Admitting: Obstetrics and Gynecology

## 2021-07-23 ENCOUNTER — Ambulatory Visit (INDEPENDENT_AMBULATORY_CARE_PROVIDER_SITE_OTHER): Payer: Managed Care, Other (non HMO) | Admitting: Obstetrics and Gynecology

## 2021-07-23 VITALS — BP 107/67 | HR 106 | Wt 151.6 lb

## 2021-07-23 DIAGNOSIS — Z3A39 39 weeks gestation of pregnancy: Secondary | ICD-10-CM

## 2021-07-23 DIAGNOSIS — Z3483 Encounter for supervision of other normal pregnancy, third trimester: Secondary | ICD-10-CM

## 2021-07-23 LAB — POCT URINALYSIS DIPSTICK OB
Bilirubin, UA: NEGATIVE
Blood, UA: NEGATIVE
Glucose, UA: NEGATIVE
Ketones, UA: NEGATIVE
Nitrite, UA: NEGATIVE
POC,PROTEIN,UA: NEGATIVE
Spec Grav, UA: 1.01 (ref 1.010–1.025)
Urobilinogen, UA: NEGATIVE E.U./dL — AB
pH, UA: 7 (ref 5.0–8.0)

## 2021-07-23 NOTE — Progress Notes (Signed)
ROB: Patient notes increased pelvic pressure and cramping. Taking red raspberry leaf tea. Discussed IOL for postdates, will schedule for 07/29/2021 at midnight. Discussed option of foley bulb outpatient for cervical ripening due to current unfavorable cervix and desires for TOLAC, patient notes she would like to do this. Will schedule for visit next week.

## 2021-07-23 NOTE — Patient Instructions (Signed)
      OTHER MEDICATIONS FOR CERVICAL RIPENING  

## 2021-07-23 NOTE — Progress Notes (Signed)
ROB. Patient states fetal movement with increased pelvic pressure. She states starting raspberry leaf tea, increased cramping and some swelling in legs. Patient states no questions or concerns at this time.

## 2021-07-24 ENCOUNTER — Encounter
Admission: EM | Disposition: A | Discharge status: Home / Self Care | Payer: Self-pay | Source: Home / Self Care | Attending: Obstetrics and Gynecology

## 2021-07-24 ENCOUNTER — Inpatient Hospital Stay
Admission: EM | Admit: 2021-07-24 | Discharge: 2021-07-26 | DRG: 788 | Disposition: A | Discharge status: Home / Self Care | Payer: Managed Care, Other (non HMO) | Attending: Obstetrics and Gynecology | Admitting: Obstetrics and Gynecology

## 2021-07-24 ENCOUNTER — Encounter: Payer: Self-pay | Admitting: Obstetrics and Gynecology

## 2021-07-24 ENCOUNTER — Inpatient Hospital Stay: Payer: Managed Care, Other (non HMO) | Admitting: Anesthesiology

## 2021-07-24 ENCOUNTER — Other Ambulatory Visit: Payer: Self-pay

## 2021-07-24 DIAGNOSIS — Z6791 Unspecified blood type, Rh negative: Secondary | ICD-10-CM

## 2021-07-24 DIAGNOSIS — O9903 Anemia complicating the puerperium: Secondary | ICD-10-CM

## 2021-07-24 DIAGNOSIS — O9081 Anemia of the puerperium: Secondary | ICD-10-CM | POA: Diagnosis not present

## 2021-07-24 DIAGNOSIS — Z98891 History of uterine scar from previous surgery: Secondary | ICD-10-CM

## 2021-07-24 DIAGNOSIS — O34211 Maternal care for low transverse scar from previous cesarean delivery: Principal | ICD-10-CM | POA: Diagnosis present

## 2021-07-24 DIAGNOSIS — O26893 Other specified pregnancy related conditions, third trimester: Secondary | ICD-10-CM | POA: Diagnosis present

## 2021-07-24 DIAGNOSIS — Z3A39 39 weeks gestation of pregnancy: Secondary | ICD-10-CM

## 2021-07-24 DIAGNOSIS — O09293 Supervision of pregnancy with other poor reproductive or obstetric history, third trimester: Secondary | ICD-10-CM

## 2021-07-24 DIAGNOSIS — D509 Iron deficiency anemia, unspecified: Secondary | ICD-10-CM | POA: Diagnosis not present

## 2021-07-24 DIAGNOSIS — O360130 Maternal care for anti-D [Rh] antibodies, third trimester, not applicable or unspecified: Secondary | ICD-10-CM

## 2021-07-24 DIAGNOSIS — Z8759 Personal history of other complications of pregnancy, childbirth and the puerperium: Secondary | ICD-10-CM

## 2021-07-24 DIAGNOSIS — D62 Acute posthemorrhagic anemia: Secondary | ICD-10-CM

## 2021-07-24 LAB — TYPE AND SCREEN
ABO/RH(D): A NEG
Antibody Screen: POSITIVE

## 2021-07-24 LAB — CBC
HCT: 33.9 % — ABNORMAL LOW (ref 36.0–46.0)
Hemoglobin: 10.7 g/dL — ABNORMAL LOW (ref 12.0–15.0)
MCH: 26.2 pg (ref 26.0–34.0)
MCHC: 31.6 g/dL (ref 30.0–36.0)
MCV: 82.9 fL (ref 80.0–100.0)
Platelets: 171 10*3/uL (ref 150–400)
RBC: 4.09 MIL/uL (ref 3.87–5.11)
RDW: 15.5 % (ref 11.5–15.5)
WBC: 10 10*3/uL (ref 4.0–10.5)
nRBC: 0 % (ref 0.0–0.2)

## 2021-07-24 SURGERY — Surgical Case
Anesthesia: Spinal

## 2021-07-24 MED ORDER — NALOXONE HCL 4 MG/10ML IJ SOLN
1.0000 ug/kg/h | INTRAVENOUS | Status: DC | PRN
  Filled 2021-07-24: qty 5

## 2021-07-24 MED ORDER — ACETAMINOPHEN 500 MG PO TABS
1000.0000 mg | ORAL_TABLET | Freq: Four times a day (QID) | ORAL | Status: DC
  Administered 2021-07-25 – 2021-07-26 (×3): 1000 mg via ORAL
  Filled 2021-07-24 (×3): qty 2

## 2021-07-24 MED ORDER — OXYTOCIN BOLUS FROM INFUSION: 333.0000 mL | Freq: Once | INTRAVENOUS | Status: DC

## 2021-07-24 MED ORDER — SCOPOLAMINE 1 MG/3DAYS TD PT72
1.0000 | MEDICATED_PATCH | Freq: Once | TRANSDERMAL | Status: DC
  Administered 2021-07-24: 1.5 mg via TRANSDERMAL
  Filled 2021-07-24: qty 1

## 2021-07-24 MED ORDER — LACTATED RINGERS IV SOLN
125.0000 mL/h | INTRAVENOUS | Status: DC
Start: 2021-07-24 — End: 2021-07-24
  Administered 2021-07-24: 125 mL/h via INTRAVENOUS

## 2021-07-24 MED ORDER — SOD CITRATE-CITRIC ACID 500-334 MG/5ML PO SOLN
ORAL | Status: AC
  Administered 2021-07-24: 30 mL via ORAL
  Filled 2021-07-24: qty 15

## 2021-07-24 MED ORDER — OXYTOCIN 10 UNIT/ML IJ SOLN
INTRAMUSCULAR | Status: AC
  Filled 2021-07-24: qty 2

## 2021-07-24 MED ORDER — PHENYLEPHRINE HCL (PRESSORS) 10 MG/ML IV SOLN
INTRAVENOUS | Status: DC | PRN
  Administered 2021-07-24 (×2): 100 ug via INTRAVENOUS

## 2021-07-24 MED ORDER — SOD CITRATE-CITRIC ACID 500-334 MG/5ML PO SOLN: 30.0000 mL | ORAL | Status: AC

## 2021-07-24 MED ORDER — LIDOCAINE HCL (PF) 1 % IJ SOLN
30.0000 mL | INTRAMUSCULAR | Status: DC | PRN
  Filled 2021-07-24: qty 30

## 2021-07-24 MED ORDER — OXYCODONE HCL 5 MG PO TABS: 5.0000 mg | ORAL_TABLET | ORAL | Status: AC | PRN

## 2021-07-24 MED ORDER — PHENYLEPHRINE HCL (PRESSORS) 10 MG/ML IV SOLN
INTRAVENOUS | Status: AC
  Filled 2021-07-24: qty 1

## 2021-07-24 MED ORDER — LACTATED RINGERS IV BOLUS
1000.0000 mL | Freq: Once | INTRAVENOUS | Status: AC
  Administered 2021-07-24: 1000 mL via INTRAVENOUS

## 2021-07-24 MED ORDER — KETOROLAC TROMETHAMINE 30 MG/ML IJ SOLN
30.0000 mg | Freq: Four times a day (QID) | INTRAMUSCULAR | Status: AC
  Administered 2021-07-25 (×3): 30 mg via INTRAVENOUS
  Filled 2021-07-24 (×4): qty 1

## 2021-07-24 MED ORDER — FENTANYL CITRATE (PF) 100 MCG/2ML IJ SOLN
INTRAMUSCULAR | Status: DC | PRN
Start: 2021-07-24 — End: 2021-07-24
  Administered 2021-07-24: 10 ug via INTRATHECAL

## 2021-07-24 MED ORDER — OXYTOCIN-SODIUM CHLORIDE 30-0.9 UT/500ML-% IV SOLN
2.5000 [IU]/h | INTRAVENOUS | Status: DC
  Administered 2021-07-24: 2.5 [IU]/h via INTRAVENOUS
  Administered 2021-07-24: 30 [IU]/h via INTRAVENOUS
  Filled 2021-07-24: qty 500

## 2021-07-24 MED ORDER — MAGNESIUM HYDROXIDE 400 MG/5ML PO SUSP: 30.0000 mL | ORAL | Status: DC | PRN

## 2021-07-24 MED ORDER — OXYCODONE-ACETAMINOPHEN 5-325 MG PO TABS: 1.0000 | ORAL_TABLET | ORAL | Status: DC | PRN

## 2021-07-24 MED ORDER — FENTANYL CITRATE (PF) 100 MCG/2ML IJ SOLN
INTRAMUSCULAR | Status: AC
  Filled 2021-07-24: qty 2

## 2021-07-24 MED ORDER — PHENYLEPHRINE HCL-NACL 20-0.9 MG/250ML-% IV SOLN
INTRAVENOUS | Status: DC | PRN
  Administered 2021-07-24: 30 ug/min via INTRAVENOUS

## 2021-07-24 MED ORDER — ONDANSETRON HCL 4 MG/2ML IJ SOLN
INTRAMUSCULAR | Status: DC | PRN
  Administered 2021-07-24: 4 mg via INTRAVENOUS

## 2021-07-24 MED ORDER — DIBUCAINE (PERIANAL) 1 % EX OINT: 1.0000 "application " | TOPICAL_OINTMENT | CUTANEOUS | Status: DC | PRN

## 2021-07-24 MED ORDER — BUPIVACAINE-MELOXICAM ER 400-12 MG/14ML IJ SOLN
300.0000 mg | Freq: Once | INTRAMUSCULAR | Status: DC
  Filled 2021-07-24: qty 1

## 2021-07-24 MED ORDER — ZOLPIDEM TARTRATE 5 MG PO TABS: 5.0000 mg | ORAL_TABLET | Freq: Every evening | ORAL | Status: DC | PRN

## 2021-07-24 MED ORDER — SODIUM CHLORIDE 0.9% FLUSH: 3.0000 mL | INTRAVENOUS | Status: DC | PRN

## 2021-07-24 MED ORDER — OXYTOCIN-SODIUM CHLORIDE 30-0.9 UT/500ML-% IV SOLN
INTRAVENOUS | Status: AC
  Filled 2021-07-24: qty 500

## 2021-07-24 MED ORDER — OXYCODONE-ACETAMINOPHEN 5-325 MG PO TABS: 2.0000 | ORAL_TABLET | ORAL | Status: DC | PRN

## 2021-07-24 MED ORDER — MORPHINE SULFATE (PF) 0.5 MG/ML IJ SOLN
INTRAMUSCULAR | Status: AC
  Filled 2021-07-24: qty 10

## 2021-07-24 MED ORDER — ACETAMINOPHEN 500 MG PO TABS: 1000.0000 mg | ORAL_TABLET | Freq: Four times a day (QID) | ORAL | Status: DC

## 2021-07-24 MED ORDER — OXYTOCIN-SODIUM CHLORIDE 30-0.9 UT/500ML-% IV SOLN: 2.5000 [IU]/h | INTRAVENOUS | Status: AC

## 2021-07-24 MED ORDER — KETOROLAC TROMETHAMINE 30 MG/ML IJ SOLN
INTRAMUSCULAR | Status: DC | PRN
  Administered 2021-07-24: 30 mg via INTRAVENOUS

## 2021-07-24 MED ORDER — AMMONIA AROMATIC IN INHA
RESPIRATORY_TRACT | Status: AC
  Filled 2021-07-24: qty 10

## 2021-07-24 MED ORDER — PRENATAL MULTIVITAMIN CH
1.0000 | ORAL_TABLET | Freq: Every day | ORAL | Status: DC
  Administered 2021-07-25 – 2021-07-26 (×2): 1 via ORAL
  Filled 2021-07-24 (×2): qty 1

## 2021-07-24 MED ORDER — GABAPENTIN 300 MG PO CAPS
300.0000 mg | ORAL_CAPSULE | Freq: Two times a day (BID) | ORAL | Status: DC
  Administered 2021-07-24 – 2021-07-26 (×4): 300 mg via ORAL
  Filled 2021-07-24 (×4): qty 1

## 2021-07-24 MED ORDER — MISOPROSTOL 200 MCG PO TABS
ORAL_TABLET | ORAL | Status: AC
  Filled 2021-07-24: qty 4

## 2021-07-24 MED ORDER — ACETAMINOPHEN 325 MG PO TABS: 650.0000 mg | ORAL_TABLET | ORAL | Status: DC | PRN

## 2021-07-24 MED ORDER — DIPHENHYDRAMINE HCL 50 MG/ML IJ SOLN: 12.5000 mg | INTRAMUSCULAR | Status: DC | PRN

## 2021-07-24 MED ORDER — HYDROMORPHONE HCL 1 MG/ML IJ SOLN: 1.0000 mg | INTRAMUSCULAR | Status: DC | PRN

## 2021-07-24 MED ORDER — CEFAZOLIN SODIUM-DEXTROSE 2-4 GM/100ML-% IV SOLN
INTRAVENOUS | Status: AC
  Filled 2021-07-24: qty 100

## 2021-07-24 MED ORDER — SENNOSIDES-DOCUSATE SODIUM 8.6-50 MG PO TABS
2.0000 | ORAL_TABLET | Freq: Every day | ORAL | Status: DC
  Administered 2021-07-25 – 2021-07-26 (×2): 2 via ORAL
  Filled 2021-07-24 (×2): qty 2

## 2021-07-24 MED ORDER — MEASLES, MUMPS & RUBELLA VAC IJ SOLR
0.5000 mL | Freq: Once | INTRAMUSCULAR | Status: DC
  Filled 2021-07-24: qty 0.5

## 2021-07-24 MED ORDER — MORPHINE SULFATE (PF) 0.5 MG/ML IJ SOLN
INTRAMUSCULAR | Status: DC | PRN
  Administered 2021-07-24: 150 ug via INTRATHECAL

## 2021-07-24 MED ORDER — LACTATED RINGERS IV SOLN: INTRAVENOUS | Status: DC

## 2021-07-24 MED ORDER — IBUPROFEN 600 MG PO TABS
600.0000 mg | ORAL_TABLET | Freq: Four times a day (QID) | ORAL | Status: DC
  Administered 2021-07-25 – 2021-07-26 (×3): 600 mg via ORAL
  Filled 2021-07-24 (×3): qty 1

## 2021-07-24 MED ORDER — KETOROLAC TROMETHAMINE 30 MG/ML IJ SOLN: 30.0000 mg | Freq: Four times a day (QID) | INTRAMUSCULAR | Status: AC | PRN

## 2021-07-24 MED ORDER — KETOROLAC TROMETHAMINE 30 MG/ML IJ SOLN
INTRAMUSCULAR | Status: AC
  Filled 2021-07-24: qty 1

## 2021-07-24 MED ORDER — ONDANSETRON HCL 4 MG/2ML IJ SOLN
4.0000 mg | Freq: Four times a day (QID) | INTRAMUSCULAR | Status: DC | PRN
Start: 2021-07-24 — End: 2021-07-24

## 2021-07-24 MED ORDER — ACETAMINOPHEN 500 MG PO TABS
1000.0000 mg | ORAL_TABLET | Freq: Four times a day (QID) | ORAL | Status: AC
  Administered 2021-07-24 – 2021-07-25 (×4): 1000 mg via ORAL
  Filled 2021-07-24 (×4): qty 2

## 2021-07-24 MED ORDER — WITCH HAZEL-GLYCERIN EX PADS: 1.0000 "application " | MEDICATED_PAD | CUTANEOUS | Status: DC | PRN

## 2021-07-24 MED ORDER — CEFAZOLIN SODIUM-DEXTROSE 2-4 GM/100ML-% IV SOLN: 2.0000 g | INTRAVENOUS | Status: DC

## 2021-07-24 MED ORDER — BUPIVACAINE-MELOXICAM ER 200-6 MG/7ML IJ SOLN
INTRAMUSCULAR | Status: DC | PRN
  Administered 2021-07-24: 12 mL

## 2021-07-24 MED ORDER — 0.9 % SODIUM CHLORIDE (POUR BTL) OPTIME
TOPICAL | Status: DC | PRN
Start: 2021-07-24 — End: 2021-07-24
  Administered 2021-07-24: 300 mL

## 2021-07-24 MED ORDER — DIPHENHYDRAMINE HCL 25 MG PO CAPS: 25.0000 mg | ORAL_CAPSULE | Freq: Four times a day (QID) | ORAL | Status: DC | PRN

## 2021-07-24 MED ORDER — CEFAZOLIN SODIUM-DEXTROSE 2-3 GM-%(50ML) IV SOLR
INTRAVENOUS | Status: DC | PRN
  Administered 2021-07-24: 2 g via INTRAVENOUS

## 2021-07-24 MED ORDER — FERROUS SULFATE 325 (65 FE) MG PO TABS
325.0000 mg | ORAL_TABLET | ORAL | Status: DC
  Administered 2021-07-25: 325 mg via ORAL
  Filled 2021-07-24: qty 1

## 2021-07-24 MED ORDER — COCONUT OIL OIL
1.0000 "application " | TOPICAL_OIL | Status: DC | PRN
  Filled 2021-07-24: qty 120

## 2021-07-24 MED ORDER — DIPHENHYDRAMINE HCL 25 MG PO CAPS: 25.0000 mg | ORAL_CAPSULE | ORAL | Status: DC | PRN

## 2021-07-24 MED ORDER — ONDANSETRON HCL 4 MG/2ML IJ SOLN
4.0000 mg | Freq: Three times a day (TID) | INTRAMUSCULAR | Status: DC | PRN
  Administered 2021-07-24: 4 mg via INTRAVENOUS
  Filled 2021-07-24: qty 2

## 2021-07-24 MED ORDER — MENTHOL 3 MG MT LOZG: 1.0000 | LOZENGE | OROMUCOSAL | Status: DC | PRN

## 2021-07-24 MED ORDER — LACTATED RINGERS IV SOLN: 500.0000 mL | INTRAVENOUS | Status: DC | PRN

## 2021-07-24 MED ORDER — NALOXONE HCL 0.4 MG/ML IJ SOLN: 0.4000 mg | INTRAMUSCULAR | Status: DC | PRN

## 2021-07-24 MED ORDER — MORPHINE SULFATE (PF) 0.5 MG/ML IJ SOLN: INTRAMUSCULAR | Status: DC | PRN

## 2021-07-24 MED ORDER — SOD CITRATE-CITRIC ACID 500-334 MG/5ML PO SOLN: 30.0000 mL | ORAL | Status: DC | PRN

## 2021-07-24 MED ORDER — BUTORPHANOL TARTRATE 1 MG/ML IJ SOLN
1.0000 mg | INTRAMUSCULAR | Status: DC | PRN
  Administered 2021-07-24: 1 mg via INTRAVENOUS
  Filled 2021-07-24: qty 1

## 2021-07-24 MED ORDER — BUPIVACAINE IN DEXTROSE 0.75-8.25 % IT SOLN
INTRATHECAL | Status: DC | PRN
Start: 2021-07-24 — End: 2021-07-24
  Administered 2021-07-24: 1.4 mL via INTRATHECAL

## 2021-07-24 MED ORDER — SIMETHICONE 80 MG PO CHEW
80.0000 mg | CHEWABLE_TABLET | ORAL | Status: DC | PRN
  Administered 2021-07-26: 80 mg via ORAL
  Filled 2021-07-24: qty 1

## 2021-07-24 MED ORDER — TERBUTALINE SULFATE 1 MG/ML IJ SOLN
0.2500 mg | Freq: Once | INTRAMUSCULAR | Status: AC | PRN
Start: 2021-07-24 — End: 2021-07-24
  Administered 2021-07-24: 0.25 mg via SUBCUTANEOUS
  Filled 2021-07-24: qty 1

## 2021-07-24 MED ORDER — OXYTOCIN-SODIUM CHLORIDE 30-0.9 UT/500ML-% IV SOLN
1.0000 m[IU]/min | INTRAVENOUS | Status: DC
  Filled 2021-07-24: qty 500

## 2021-07-24 MED ORDER — OXYCODONE HCL 5 MG PO TABS
5.0000 mg | ORAL_TABLET | ORAL | Status: DC | PRN
  Administered 2021-07-26: 10 mg via ORAL
  Filled 2021-07-24: qty 2

## 2021-07-24 SURGICAL SUPPLY — 33 items
BACTOSHIELD CHG 4% 4OZ (MISCELLANEOUS) ×1
BAG COUNTER SPONGE SURGICOUNT (BAG) ×2 IMPLANT
BNDG TENSOPLAST 6X5 (GAUZE/BANDAGES/DRESSINGS) ×1 IMPLANT
CHLORAPREP W/TINT 26 (MISCELLANEOUS) ×4 IMPLANT
CLOSURE STERI STRIP 1/2 X4 (GAUZE/BANDAGES/DRESSINGS) ×1 IMPLANT
DRSG CURAFIL 4X4 STRL (GAUZE/BANDAGES/DRESSINGS) IMPLANT
DRSG TELFA 3X8 NADH (GAUZE/BANDAGES/DRESSINGS) ×2 IMPLANT
ELECT REM PT RETURN 9FT ADLT (ELECTROSURGICAL) ×2
ELECTRODE REM PT RTRN 9FT ADLT (ELECTROSURGICAL) ×1 IMPLANT
EXTRT SYSTEM ALEXIS 17CM (MISCELLANEOUS)
GAUZE CURAFIL 4X4 (GAUZE/BANDAGES/DRESSINGS) IMPLANT
GAUZE PAD ABD 8X10 STRL (GAUZE/BANDAGES/DRESSINGS) ×2 IMPLANT
GAUZE SPONGE 4X4 12PLY STRL (GAUZE/BANDAGES/DRESSINGS) ×2 IMPLANT
GLOVE BIO SURGEON STRL SZ 6.5 (GLOVE) ×2 IMPLANT
GLOVE SURG UNDER LTX SZ7 (GLOVE) ×2 IMPLANT
GOWN STRL REUS W/ TWL LRG LVL3 (GOWN DISPOSABLE) ×2 IMPLANT
GOWN STRL REUS W/TWL LRG LVL3 (GOWN DISPOSABLE) ×2
KIT TURNOVER KIT A (KITS) ×2 IMPLANT
MANIFOLD NEPTUNE II (INSTRUMENTS) ×2 IMPLANT
MAT PREVALON FULL STRYKER (MISCELLANEOUS) ×2 IMPLANT
NS IRRIG 1000ML POUR BTL (IV SOLUTION) ×2 IMPLANT
PACK C SECTION AR (MISCELLANEOUS) ×2 IMPLANT
PAD DRESSING TELFA 3X8 NADH (GAUZE/BANDAGES/DRESSINGS) ×1 IMPLANT
PAD OB MATERNITY 4.3X12.25 (PERSONAL CARE ITEMS) ×2 IMPLANT
PAD PREP 24X41 OB/GYN DISP (PERSONAL CARE ITEMS) ×2 IMPLANT
SCRUB CHG 4% DYNA-HEX 4OZ (MISCELLANEOUS) ×1 IMPLANT
SUT MNCRL AB 4-0 PS2 18 (SUTURE) ×2 IMPLANT
SUT PLAIN 2 0 XLH (SUTURE) IMPLANT
SUT VIC AB 0 CT1 36 (SUTURE) ×8 IMPLANT
SUT VIC AB 3-0 SH 27 (SUTURE) ×1
SUT VIC AB 3-0 SH 27X BRD (SUTURE) ×1 IMPLANT
SYSTEM CONTND EXTRCTN KII BLLN (MISCELLANEOUS) IMPLANT
WATER STERILE IRR 500ML POUR (IV SOLUTION) ×2 IMPLANT

## 2021-07-24 NOTE — Transfer of Care (Signed)
Immediate Anesthesia Transfer of Care Note  Patient: Nancy Strong  Procedure(s) Performed: CESAREAN SECTION  Patient Location: Mother/Baby  Anesthesia Type:MAC and Spinal  Level of Consciousness: awake, alert  and oriented  Airway & Oxygen Therapy: Patient Spontanous Breathing  Post-op Assessment: Report given to RN and Post -op Vital signs reviewed and stable  Post vital signs: Reviewed and stable  Last Vitals:  Vitals Value Taken Time  BP 106/61 1916  Temp    Pulse 88 1916  Resp 12 1916  SpO2 100 1916    Last Pain:  Vitals:   07/24/21 1323  TempSrc:   PainSc: 9          Complications: No notable events documented.

## 2021-07-24 NOTE — Anesthesia Preprocedure Evaluation (Addendum)
Anesthesia Evaluation  Patient identified by MRN, date of birth, ID band Patient awake    Reviewed: Allergy & Precautions, H&P , NPO status , Patient's Chart, lab work & pertinent test results  Airway Mallampati: III  TM Distance: >3 FB Neck ROM: full    Dental   Pulmonary neg pulmonary ROS,    breath sounds clear to auscultation       Cardiovascular Exercise Tolerance: Good (-) hypertensionnegative cardio ROS   Rhythm:regular Rate:Normal     Neuro/Psych    GI/Hepatic negative GI ROS,   Endo/Other    Renal/GU   negative genitourinary   Musculoskeletal   Abdominal   Peds  Hematology negative hematology ROS (+)   Anesthesia Other Findings Past Medical History: No date: History of ectopic pregnancy     Comment:  x 2 No date: History of PCOS  Past Surgical History: No date: CESAREAN SECTION  BMI    Body Mass Index: 28.72 kg/m      Reproductive/Obstetrics (+) Pregnancy H/o prior C/S x1 Was planning TOLAC, changed her mind                            Anesthesia Physical Anesthesia Plan  ASA: 2  Anesthesia Plan: Spinal   Post-op Pain Management:    Induction:   PONV Risk Score and Plan:   Airway Management Planned:   Additional Equipment:   Intra-op Plan:   Post-operative Plan:   Informed Consent: I have reviewed the patients History and Physical, chart, labs and discussed the procedure including the risks, benefits and alternatives for the proposed anesthesia with the patient or authorized representative who has indicated his/her understanding and acceptance.     Dental Advisory Given  Plan Discussed with: Anesthesiologist  Anesthesia Plan Comments: (Discussed risks and benefits.  Pt agrees to the anesthetic plan. [facility] [place])        Anesthesia Quick Evaluation

## 2021-07-24 NOTE — H&P (Signed)
Obstetric History and Physical  Nancy Strong is a 31 y.o. [phone] with IUP at 8270w5d presenting for complaints of contractions since 0400 this morning. Patient states she has been having  irregular, every 2-5 minutes contractions,  no  vaginal bleeding, intact membranes, with active fetal movement.   Patient with h/o C/S x 1, initially desiring TOLAC, however now patient is very uncomfortable despite dose of Toradol, does not desire to continue trial of labor.   Prenatal Course Source of Care: [facility]  with onset of care at 8 weeks Pregnancy complications or risks: Patient Active Problem List   Diagnosis Date Noted   Labor and delivery indication for care or intervention 07/24/2021   Rh negative state in antepartum period 03/04/2021   History of cesarean delivery 03/04/2021   History of prior pregnancy with IUGR newborn 03/04/2021   Right tubal pregnancy without intrauterine pregnancy 08/22/2016   She plans to breastfeed She desires  Xulane patch  for postpartum contraception.   Prenatal labs and studies: ABO, Rh: --/--/A NEG (05/20 1410) Antibody: POS (05/20 1410) Rubella: 3.33 (10/10 1024) RPR: Non Reactive (03/01 0952)  HBsAg: Negative (10/10 1024)  HIV: Non Reactive (10/10 1024)  AVW:UJWJXBJY/GBS:Negative/-- (05/04 0807) 1 hr Glucola  normal Genetic screening normal Anatomy US normal   Past Medical History:  Diagnosis Date   History of ectopic pregnancy    x 2   History of PCOS     Past Surgical History:  Procedure Laterality Date   CESAREAN SECTION      OB History  Gravida Para Term Preterm AB Living  5 1 1   3 1   SAB IAB Ectopic Multiple Live Births  1   2   1     # Outcome Date GA Lbr Len/2nd Weight Sex Delivery Anes PTL Lv  5 Current           4 SAB 01/06/20        ND  3 Term 03/03/19 5310w2d  2495 g M CS-LTranv   LIV     Complications: IUGR (intrauterine growth restriction) affecting care of mother, Failure to progress in labor  2 Ectopic 2018           1 Ectopic 2014            Social History   Socioeconomic History   Marital status: Single    Spouse name: Not on file   Number of children: 1   Years of education: 12+   Highest education level: High school graduate  Occupational History   Not on file  Tobacco Use   Smoking status: Never   Smokeless tobacco: Never  Vaping Use   Vaping Use: Never used  Substance and Sexual Activity   Alcohol use: Not Currently   Drug use: Never   Sexual activity: Yes    Birth control/protection: None  Other Topics Concern   Not on file  Social History Narrative   Not on file   Social Determinants of Health   Financial Resource Strain: Not on file  Food Insecurity: Not on file  Transportation Needs: Not on file  Physical Activity: Not on file  Stress: Not on file  Social Connections: Not on file    Family History  Problem Relation Age of Onset   Healthy Mother    Hypertension Father    Breast cancer Sister    Breast cancer Maternal Aunt    Diabetes Maternal Uncle    Diabetes Maternal Grandmother  Stroke Maternal Grandfather    Ovarian cancer Neg Hx     Medications Prior to Admission  Medication Sig Dispense Refill Last Dose   Ferrous Sulfate (IRON PO) Take by mouth.   07/23/2021   Prenatal Vit-Fe Fumarate-FA (PRENATAL VITAMINS PLUS PO) Take by mouth.   07/23/2021    No Known Allergies  Review of Systems: Negative except for what is mentioned in HPI.  Physical Exam: BP 110/68 (BP Location: Left Arm)   Pulse 92   Temp 98.2 F (36.8 C) (Oral)   Resp 18   Ht 5\' 1"  (1.549 m)   Wt 68.9 kg Comment: 152lbs  LMP 11/12/2020 (Exact Date)   BMI 28.72 kg/m  CONSTITUTIONAL: Well-developed, well-nourished female in no acute distress.  HENT:  Normocephalic, atraumatic, External right and left ear normal. Oropharynx is clear and moist EYES: Conjunctivae and EOM are normal. Pupils are equal, round, and reactive to light. No scleral icterus.  NECK: Normal range of motion,  supple, no masses SKIN: Skin is warm and dry. No rash noted. Not diaphoretic. No erythema. No pallor. NEUROLOGIC: Alert and oriented to person, place, and time. Normal reflexes, muscle tone coordination. No cranial nerve deficit noted. PSYCHIATRIC: Normal mood and affect. Normal behavior. Normal judgment and thought content. CARDIOVASCULAR: Normal heart rate noted, regular rhythm RESPIRATORY: Effort and breath sounds normal, no problems with respiration noted ABDOMEN: Soft, nontender, nondistended, gravid. MUSCULOSKELETAL: Normal range of motion. No edema and no tenderness. 2+ distal pulses.  Cervical Exam: Dilatation 2 cm   Effacement 60-70%   Station -3 (changed from 0.5/thick)  . Narrow pubic arch Presentation: cephalic FHT:  Baseline rate 125 bpm   Variability moderate  Accelerations present   Decelerations none Contractions: Every 2-5 mins   Pertinent Labs/Studies:   Results for orders placed or performed during the hospital encounter of 07/24/21 (from the past 24 hour(s))  CBC     Status: Abnormal   Collection Time: 07/24/21  1:28 PM  Result Value Ref Range   WBC 10.0 4.0 - 10.5 K/uL   RBC 4.09 3.87 - 5.11 MIL/uL   Hemoglobin 10.7 (L) 12.0 - 15.0 g/dL   HCT 07/26/21 (L) 25.4 - 27.0 %   MCV 82.9 80.0 - 100.0 fL   MCH 26.2 26.0 - 34.0 pg   MCHC 31.6 30.0 - 36.0 g/dL   RDW 62.3 76.2 - 83.1 %   Platelets 171 150 - 400 K/uL   nRBC 0.0 0.0 - 0.2 %  Type and screen [facility]     Status: None (Preliminary result)   Collection Time: 07/24/21  1:28 PM  Result Value Ref Range   ABO/RH(D) PENDING    Antibody Screen PENDING    Sample Expiration      07/27/2021,2359 Performed at [facility], [address] [place], [place]   Type and screen     Status: None   Collection Time: 07/24/21  2:10 PM  Result Value Ref Range   ABO/RH(D) A NEG    Antibody Screen POS    Sample Expiration 07/27/2021,2359    Antibody Identification      PASSIVELY  ACQUIRED ANTI-D Performed at [facility], [address] [place], [place]     Assessment : Nancy Strong is a 31 y.o. 34 at [redacted]w[redacted]d being admitted for latent labor. Rh negative. H/o C-section x 1 initially desiring TOLAC, however now desires repeat C-section. Rh negative. H/o IUGR in prior pregnancy.  Plan: Labor:Patient initially desired  TOLAC.  Currently in latent labor. Now notes that she is afraid that she will labor again for a long time and still require a C-section (got to 5 cm last time and did not dilate further). Patient's mother also expresses concern about patient's risk of uterine rupture (explained risk is 1%, had previously been discussed with patient in prenatal visits).  Offered patient option of getting epidural early to help with pain management, patient notes concerns about 1-sided working epidural as this occurred with her last labor. Discussed that I could not 100% guarantee that her epidural would completely manage her pain (although would be performed by a different doctor at a different facility), and that there could also be factors such as her own body's pain receptors or spine alignment that can affect efficacy as well. Patient notes with all of these factors she would prefer to just proceed with C-section. No longer desires trial of labor.  FWB: Reassuring fetal heart tracing.  GBS negative Delivery plan: Now planning for repeat C-section, no longer desires trial of labor.  The risks of surgery were discussed with the patient including, but not limited to:  bleeding which may require transfusion or reoperation; infection which may require antibiotics; injury to the fetus; need for additional procedures including hysterectomy in the event of a life-threatening event; injury to bowel, bladder, ureters or other surrounding organs, hemorrhage, formation of adhesions; placental abnormalities with subsequent pregnancies; incisional problems;  thromboembolic phenomenon and other postoperative/anesthesia complications.  The patient concurred with the proposed plan, giving informed written consent for the procedure.   Patient has been NPO since 9 pm yesterday, she will remain NPO for procedure. Anesthesia and OR aware. Preoperative prophylactic antibiotics and SCDs ordered on call to the OR.  To OR when ready.    Hildred Laser, MD [facility]

## 2021-07-24 NOTE — Op Note (Addendum)
Cesarean Section Procedure Note  Indications: previous C-section x 1 in latent labor, patient declines vag del attempt  Pre-operative Diagnosis: 39 week 5 day pregnancy, latent labor, history of C-section x 1 declining vaginal attempt.  Rh negative. H/o IUGR in previous pregnancy. Narrow pubic arch.  Post-operative Diagnosis: same  Surgeon: Hildred Laser, MD  Assistants:  Carie Caddy, CNM.    Procedure: Primary low transverse Cesarean Section, escharotomy  Anesthesia: Spinal anesthesia  Findings: Female infant, cephalic presentation, 3430 grams, with Apgar scores of 8 at one minute and 9 at five minutes. Intact placenta with 3 vessel cord.  Clear amniotic fluid at amniotomy Thick adhesion band of the anterior surface of the uterus to the anterior abdominal wall The uterine outline, tubes and ovaries appeared normal.   Procedure Details: The patient was seen in the Holding Room. The risks, benefits, complications, treatment options, and expected outcomes were discussed with the patient.  The patient concurred with the proposed plan, giving informed consent.  The site of surgery properly noted/marked. The patient was taken to the Operating Room, identified as Nancy Strong and the procedure verified as C-Section Delivery. A Time Out was held and the above information confirmed.  After induction of anesthesia, the patient was draped and prepped in the usual sterile manner. Anesthesia was tested and noted to be adequate. A Pfannenstiel incision was made and carried down through the subcutaneous tissue to the fascia. Fascial incision was made and extended transversely. The fascia was separated from the underlying rectus tissue superiorly and inferiorly. The peritoneum was identified and entered. Peritoneal incision was extended longitudinally. The surgical assist was able to provide retraction to allow for clear visualization of surgical site.  A thick adhesion band of the anterior  surface of the uterus to the abdominal wall was encountered. The adhesion band was lysed using the bovie. An Alexis retractor was then inserted into the abdominal cavity for additional retraction. The utero-vesical peritoneal reflection was incised transversely and the bladder flap was bluntly freed from the lower uterine segment. A low transverse uterine incision was made. Delivered from cephalic presentation was a 3430 gram Female with Apgar scores of 8 at one minute and 9 at five minutes.  The assistant was able to apply adequate fundal pressure to allow for successful delivery of the fetus. After the umbilical cord was clamped and cut, cord blood was obtained for evaluation. Delayed cord clamping was observed. The placenta was removed intact and appeared normal. The uterus was exteriorized and cleared of all clots and debris. The uterine outline, tubes and ovaries appeared normal.  The uterine incision was closed with running locked sutures of 0-Vicryl.  A second suture of 0-Vicryl was used in an imbricating layer.  Hemostasis was observed. The uterus was then returned to the abdomen. The pericolic gutters were cleared of all clots and debris.  The periotoneum was then grasped with Kocher clamps and reapproximated in a running fashion using 3-0 Vicryl.  Six ml of Zynrelef (topical bupivicaine and meloxicam) was applied to the peritoneum. The fascia was then reapproximated with a running suture of 0-Vicryl. An additional 6 ml of Zynrelef was applied to the fascia layer. The skin was grasped at the level of the previous scar using a series of Alice clamps.  An escharotomy was performed using the scalpel.  Hemostasis was achieved with the bovie.  The skin was then reapproximated with 4-0 Monocryl. The incision was covered with steri-strips and a pressure dressing.   Instrument, sponge, and  needle counts were correct prior the abdominal closure and at the conclusion of the case.   An experienced assistant was  required given the standard of surgical care given the complexity of the case.  This assistant was needed for exposure, dissection, suctioning, retraction, instrument exchange, and for overall help during the procedure.   Estimated Blood Loss:  600 ml      Drains: foley catheter to gravity drainage, 75 ml of clear urine at end of the procedure         Total IV Fluids:  800 ml  Specimens: Cord blood         Implants: None         Complications:  None; patient tolerated the procedure well.         Disposition: PACU - hemodynamically stable.         Condition: stable   Hildred Laser, MD [facility]

## 2021-07-24 NOTE — OB Triage Note (Signed)
Patient comes to OBS 4 with complaint of contractions that began around 3-4am this morning and have increased in intensity and frequency.  Patient had a previous cesarean section.  She plans to [facility] with this pregnancy.  Patient rates contraction pain 8/10 and is breathing well through contractions. Monitors applied and assessing.

## 2021-07-24 NOTE — Anesthesia Procedure Notes (Signed)
Spinal  Patient location during procedure: OR Start time: 07/24/2021 5:39 PM End time: 07/24/2021 5:41 PM Reason for block: surgical anesthesia Staffing Performed: anesthesiologist  Anesthesiologist: Tera Mater, MD Preanesthetic Checklist Completed: patient identified, IV checked, site marked, risks and benefits discussed, surgical consent, monitors and equipment checked, pre-op evaluation and timeout performed Spinal Block Patient position: sitting Prep: ChloraPrep Patient monitoring: heart rate, continuous pulse ox, blood pressure and cardiac monitor Approach: midline Location: L4-5 Injection technique: single-shot Needle Needle type: Whitacre and Introducer  Needle gauge: 24 G Needle length: 9 cm Assessment Events: CSF return Additional Notes Negative paresthesia. Negative blood return. Positive free-flowing CSF. Expiration date of kit checked and confirmed. Patient tolerated procedure well, without complications.

## 2021-07-25 LAB — FETAL SCREEN: Fetal Screen: NEGATIVE

## 2021-07-25 LAB — CBC
HCT: 24.2 % — ABNORMAL LOW (ref 36.0–46.0)
Hemoglobin: 7.8 g/dL — ABNORMAL LOW (ref 12.0–15.0)
MCH: 26.8 pg (ref 26.0–34.0)
MCHC: 32.2 g/dL (ref 30.0–36.0)
MCV: 83.2 fL (ref 80.0–100.0)
Platelets: 131 10*3/uL — ABNORMAL LOW (ref 150–400)
RBC: 2.91 MIL/uL — ABNORMAL LOW (ref 3.87–5.11)
RDW: 15.5 % (ref 11.5–15.5)
WBC: 9.4 10*3/uL (ref 4.0–10.5)
nRBC: 0 % (ref 0.0–0.2)

## 2021-07-25 LAB — RPR: RPR Ser Ql: NONREACTIVE

## 2021-07-25 MED ORDER — LACTATED RINGERS IV BOLUS
500.0000 mL | Freq: Once | INTRAVENOUS | Status: AC
  Administered 2021-07-25: 500 mL via INTRAVENOUS

## 2021-07-25 NOTE — Lactation Note (Signed)
This note was copied from a baby's chart. Lactation Consultation Note  Patient Name: Nancy Strong [id number] Date: 07/25/2021 Reason for consult: Initial assessment;Term;Nipple pain/trauma;Other (Comment) (blood in colostrum) Age:31 years  Maternal Data Has patient been taught Hand Expression?: Yes Does the patient have breastfeeding experience prior to this delivery?: Yes How long did the patient breastfeed?: 6 months  Mom and care RN report there being blood in colostrum (inside the nipple shield) when feeding. Mom states that the same thing happened with her other child (now 36yrs old).  Feeding Mother's Current Feeding Choice: Breast Milk  During this visit mom was given donor breast milk, baby bottle fed well and tolerated 54mL.  LATCH Score Latch: Repeated attempts needed to sustain latch, nipple held in mouth throughout feeding, stimulation needed to elicit sucking reflex.  Audible Swallowing: Spontaneous and intermittent  Type of Nipple: Flat  Comfort (Breast/Nipple): Filling, red/small blisters or bruises, mild/mod discomfort  Hold (Positioning): Assistance needed to correctly position infant at breast and maintain latch.  LATCH Score: 6   Lactation Tools Discussed/Used Tools: Nipple Shields Nipple shield size: 24  Interventions Interventions: Breast feeding basics reviewed;Hand express;Coconut oil;Comfort gels;DEBP;Education  We discussed the possibility of broken capillaries in the breast due to swelling/pressure, blood in colostrum/milk was safe for consumption, and feeding plan today to help determine possible cause of blood.  Feeding plan: -Next feeding attempt LC to be present to observe/monitor position/latch and potential transfer. -Set up of DEBP to observe output of colostrum (color/blood presence).  Discharge    Consult Status Consult Status: Follow-up Date: 07/25/21 Follow-up type: In-patient  LC to be called at next feeding  attempt.  Danford Bad 07/25/2021, 8:47 AM

## 2021-07-25 NOTE — Lactation Note (Signed)
This note was copied from a baby's chart. Lactation Consultation Note  Patient Name: Nancy Strong [id number] Date: 07/25/2021 Reason for consult: Follow-up assessment;Mother's request;Nipple pain/trauma;Term;Other (Comment);Breastfeeding assistance (c-section) Age:31 years  Maternal Data Has patient been taught Hand Expression?: Yes Does the patient have breastfeeding experience prior to this delivery?: Yes How long did the patient breastfeed?: 6 months  LC at bedside to observe feeding attempt with 32hr old baby. Baby awake and alert. Mom already noting tender nipples before applying shield or baby.  Feeding Mother's Current Feeding Choice: Breast Milk Nipple Type: Slow - flow  LATCH Score Latch: Grasps breast easily, tongue down, lips flanged, rhythmical sucking.  Audible Swallowing: None  Type of Nipple: Inverted  Comfort (Breast/Nipple): Filling, red/small blisters or bruises, mild/mod discomfort  Hold (Positioning): Assistance needed to correctly position infant at breast and maintain latch.  LATCH Score: 4  LC assisted with positioning and latch of baby in football hold on R breast. Initially mom was comfortable, but then started to note pain/discomfort. Blood visible in shield. Mom has inverted nipples that appear aggravated from the suckling. Mom requested after 4 minutes to discontinue the feeding.  Lactation Tools Discussed/Used Tools: Pump Nipple shield size: 24 Breast pump type: Double-Electric Breast Pump Pump Education: Setup, frequency, and cleaning;Milk Storage Reason for Pumping: stimulation; difficult latch; inverted nipples Pumping frequency: q 3 hrs  Interventions Interventions: Breast feeding basics reviewed;Assisted with latch;Hand express;Breast compression;Adjust position;Support pillows;Position options;Coconut oil;Comfort gels;Education  Pump set-up for ongoing needed stimulation; education and guidance given for use.  Feeding  plan: -Attempt at breast as tolerated -Offer supplement -Pump for 15 minutes  Discharge Pump: Personal (Motif (had through insurance with other child))  Consult Status Consult Status: Follow-up Date: 07/25/21 Follow-up type: In-patient    Lavonia Drafts 07/25/2021, 1:58 PM

## 2021-07-25 NOTE — Progress Notes (Signed)
Postpartum Day # 1: Cesarean Delivery (repeat)  Subjective: Patient reports tolerating PO, + flatus, and no problems voiding.  Ambulating without difficulty. Reports minimal pain. Notes some bleeding from nipples when breastfeeding, does hurt with latching. Will be working with Advertising copywriter today.   Objective: Vital signs in last 24 hours: Temp:  [97.6 F (36.4 C)-98.8 F (37.1 C)] 98.1 F (36.7 C) (05/21 0746) Pulse Rate:  [75-108] 99 (05/21 0746) Resp:  [13-20] 16 (05/21 0746) BP: (95-113)/(61-70) 95/70 (05/21 0746) SpO2:  [92 %-100 %] 97 % (05/21 0746)  Physical Exam:  General: alert and no distress Lungs: clear to auscultation bilaterally Breasts: normal appearance, no masses or tenderness Heart: regular rate and rhythm, S1, S2 normal, no murmur, click, rub or gallop Abdomen: soft, non-tender; bowel sounds normal; no masses,  no organomegaly Pelvis: Lochia appropriate, Uterine Fundus firm, Incision: healing well, no significant drainage, no dehiscence, no significant erythema. Moderate old blood on bandage.  Extremities: DVT Evaluation: No evidence of DVT seen on physical exam. Negative Homan's sign. No cords or calf tenderness. No significant calf/ankle edema. Positive Homan's sign.  Recent Labs    07/24/21 1328 07/25/21 0856  HGB 10.7* 7.8*  HCT 33.9* 24.2*    Assessment/Plan: Status post Cesarean section. Doing well postoperatively.  Breastfeeding, Lactation consult. Using donor milk for now.  Contraception Xulane patch Regular diet Continue PO pain management Remove foley catheter Discontinue IVF.  Anemia (iron deficiency) postpartum s/p surgical blood loss. Asymptomatic. Will treat with PO iron.  Continue current care.  Hildred Laser [facility]

## 2021-07-26 ENCOUNTER — Encounter: Payer: Self-pay | Admitting: Obstetrics and Gynecology

## 2021-07-26 ENCOUNTER — Inpatient Hospital Stay: Admit: 2021-07-26 | Payer: Self-pay

## 2021-07-26 LAB — CBC
HCT: 23.1 % — ABNORMAL LOW (ref 36.0–46.0)
Hemoglobin: 7.3 g/dL — ABNORMAL LOW (ref 12.0–15.0)
MCH: 26.9 pg (ref 26.0–34.0)
MCHC: 31.6 g/dL (ref 30.0–36.0)
MCV: 85.2 fL (ref 80.0–100.0)
Platelets: 155 10*3/uL (ref 150–400)
RBC: 2.71 MIL/uL — ABNORMAL LOW (ref 3.87–5.11)
RDW: 16.1 % — ABNORMAL HIGH (ref 11.5–15.5)
WBC: 10.9 10*3/uL — ABNORMAL HIGH (ref 4.0–10.5)
nRBC: 0 % (ref 0.0–0.2)

## 2021-07-26 MED ORDER — ACETAMINOPHEN 500 MG PO TABS: 1000.0000 mg | ORAL_TABLET | Freq: Four times a day (QID) | ORAL | 0 refills | Status: AC

## 2021-07-26 MED ORDER — RHO D IMMUNE GLOBULIN 1500 UNIT/2ML IJ SOSY
300.0000 ug | PREFILLED_SYRINGE | Freq: Once | INTRAMUSCULAR | Status: AC
  Administered 2021-07-26: 300 ug via INTRAVENOUS
  Filled 2021-07-26: qty 2

## 2021-07-26 MED ORDER — COCONUT OIL OIL
1.0000 | TOPICAL_OIL | 0 refills | Status: AC | PRN
Start: 2021-07-26 — End: ?

## 2021-07-26 MED ORDER — IBUPROFEN 600 MG PO TABS: 600.0000 mg | ORAL_TABLET | Freq: Four times a day (QID) | ORAL | 0 refills | Status: DC

## 2021-07-26 MED ORDER — FERROUS SULFATE 325 (65 FE) MG PO TABS: 325.0000 mg | ORAL_TABLET | Freq: Two times a day (BID) | ORAL | 3 refills | Status: AC

## 2021-07-26 MED ORDER — PRENATAL MULTIVITAMIN CH: 1.0000 | ORAL_TABLET | Freq: Every day | ORAL | 6 refills | Status: DC

## 2021-07-26 MED ORDER — SIMETHICONE 80 MG PO CHEW: 80.0000 mg | CHEWABLE_TABLET | ORAL | 0 refills | Status: AC | PRN

## 2021-07-26 MED ORDER — OXYCODONE HCL 5 MG PO TABS: 5.0000 mg | ORAL_TABLET | ORAL | 0 refills | Status: AC | PRN

## 2021-07-26 MED ORDER — FERROUS SULFATE 325 (65 FE) MG PO TABS: 325.0000 mg | ORAL_TABLET | Freq: Two times a day (BID) | ORAL | Status: DC

## 2021-07-26 MED ORDER — GABAPENTIN 300 MG PO CAPS: 300.0000 mg | ORAL_CAPSULE | Freq: Two times a day (BID) | ORAL | 0 refills | Status: DC

## 2021-07-26 NOTE — Discharge Summary (Signed)
Obstetrical Discharge Summary  Date of Admission: 07/24/2021 Date of Discharge: 07/26/2021  Primary OB: [facility]  Gestational Age at Delivery: [redacted]w[redacted]d   Antepartum complications: none Reason for Admission: Labor Date of Delivery: 07/24/2021  Delivered By: Hildred Laser, MD  Delivery Type: repeat cesarean section, low transverse incision Intrapartum complications/course: None Anesthesia: spinal Placenta: Delivered and expressed via active management. Intact: yes. To pathology: no.  Laceration: n/a Episiotomy: LCT uterus EBL: Baby: Liveborn female, APGARs 8/9, weight 3355 g.    Discharge Diagnosis: Delivered. Post OP day 2  Postpartum course: Uneventful PP course.  Ambulating and voiding without difficulty. Using oxy as needed for pain.  Has a good appetite. Has been passing flatus. Hemoglobin dropped to 7.3, pt denies any symptoms related to blood loss.  Breastfeeding is a work in progress, has been pumping and supplementing with DBM and formula. Pt reports her breasts feel fuller. Mood is good, is ready to go home. Has partner and both of their families for support. Pt admits to having "postpartum" with her last child but feels more prepared this time.  Discharge Vital Signs:  Current Vital Signs 24h Vital Sign Ranges  T 98.1 F (36.7 C) Temp  Avg: 98.3 F (36.8 C)  Min: 98.1 F (36.7 C)  Max: 98.6 F (37 C)  BP 102/60 BP  Min: 99/56  Max: 107/68  HR 94 Pulse  Avg: 102.5  Min: 94  Max: 111  RR 20 Resp  Avg: 18  Min: 14  Max: 20  SaO2 99 % Room Air SpO2  Avg: 96 %  Min: 94 %  Max: 99 %       24 Hour I/O Current Shift I/O  Time Ins Outs 05/21 0701 - 05/22 0700 In: 480 [P.O.:480] Out: 1000 [Urine:1000] No intake/output data recorded.     Discharge Exam:  NAD Breast: filling, no masses or discoloration, nipples flat and intact bilaterally  Abdomen: firm fundus below the umbilicus, NTTP, non distended, +bowel sounds.  Incision c/d/i with pressure dressing present  RRR no  MRGs CTAB Ext: trace BLE, negative homan's sign, wearing compression stockings.   Recent Labs  Lab 07/24/21 1328 07/25/21 0856 07/26/21 0939  WBC 10.0 9.4 10.9*  HGB 10.7* 7.8* 7.3*  HCT 33.9* 24.2* 23.1*  PLT 171 131* 155    Disposition: Home  Rh Immune globulin given: yes Rubella vaccine given: no Tdap vaccine given in AP or PP setting: yes Flu vaccine given in AP or PP setting: no  Contraception:  patch   Prenatal/Postnatal Panel: A NEG//Rubella Immune//Varicella Immune//RPR negative//HIV negative/HepB Surface Ag negative//pap ASCUS with NEGATIVE high risk HPV (date: 07/23/2020)//plans to breastfeed  Plan:  ILEE RANDLEMAN was discharged to home in good condition. Follow-up appointment with A Cherry  in 1 week for a post op check followed by 6wk PP visit  Future Appointments  Date Time Provider Department Center  08/03/2021  2:30 PM Hildred Laser, MD [facility] None  09/08/2021 10:30 AM Hildred Laser, MD [facility] None    Discharge Medications:

## 2021-07-26 NOTE — Lactation Note (Signed)
This note was copied from a baby's chart. Lactation Consultation Note  Patient Name: Nancy Strong [id number] Date: 07/26/2021 Reason for consult: Follow-up assessment;Term;Other (Comment);Nipple pain/trauma (c-section; inverted nipples; heavy breasts) Age:32 hours  Maternal Data Has patient been taught Hand Expression?: Yes Does the patient have breastfeeding experience prior to this delivery?: Yes How long did the patient breastfeed?: 6 months  Mom has bilateral inverted nipples that are painful with suckling and pumping. Mom continues to attempt at the breast with baby and stimulate via pump every 3 hours. Mom is starting to feel heaviness in the tissue and an increase in the drops expressed.  Feeding Mother's Current Feeding Choice: Breast Milk Nipple Type: Slow - flow  Mom has been offering donor breast milk while trying to build and establish a milk supply.  LATCH Score   Lactation Tools Discussed/Used Tools: Pump;Coconut oil;Comfort gels;Nipple Dorris Carnes;Bottle Nipple shield size: 24 Breast pump type: Double-Electric Breast Pump Reason for Pumping: inverted nipples Pumping frequency: q 3 hours  Interventions Interventions: Breast feeding basics reviewed;Breast massage;Hand express;Skin to skin;Breast compression;Reverse pressure;Expressed milk;Coconut oil;Comfort gels;DEBP;Ice;Education;Pace feeding  Discharge Discharge Education: Engorgement and breast care;Warning signs for feeding baby;Outpatient recommendation Pump: Personal (Motif; applied for another pump through insurance)  Encouraged frequent breast emptying minimum every 3 hours, hands on pumping, and hand expression post pumping for optimal milk removal. Tips given for breast swelling and engorgement control. Encouraged continued use of coconut oil and comfort gels for healing and comfort.  Consult Status Consult Status: Complete  Outpatient lactation service information given; encouraged mom to call  for ongoing support as needed.  Danford Bad 07/26/2021, 10:03 AM

## 2021-07-26 NOTE — Anesthesia Postprocedure Evaluation (Signed)
Anesthesia Post Note  Patient: Nancy Strong  Procedure(s) Performed: CESAREAN SECTION  Patient location during evaluation: Mother Baby Anesthesia Type: Spinal Level of consciousness: oriented and awake and alert Pain management: pain level controlled Vital Signs Assessment: post-procedure vital signs reviewed and stable Respiratory status: spontaneous breathing and respiratory function stable Cardiovascular status: blood pressure returned to baseline and stable Postop Assessment: no headache, no backache, no apparent nausea or vomiting and able to ambulate Anesthetic complications: no   No notable events documented.   Last Vitals:  Vitals:   07/26/21 0015 07/26/21 0811  BP: 107/68 102/60  Pulse: (!) 111 94  Resp: 20 20  Temp: 37 C 36.7 C  SpO2: 96% 99%    Last Pain:  Vitals:   07/26/21 0811  TempSrc: Oral  PainSc:                  Karoline Caldwell

## 2021-07-26 NOTE — Progress Notes (Signed)
Mother discharged. Discharge instructions given. Mother verbalizes understanding. Transported by axillary.  

## 2021-07-27 ENCOUNTER — Telehealth: Payer: Self-pay

## 2021-07-27 LAB — RHOGAM INJECTION: Unit division: 0

## 2021-07-27 NOTE — Telephone Encounter (Signed)
[facility] [place] calling; they recv'd rx from LMD for pnv but no specifics; pharm needs specifics on what we want pt to have.  [phone]  Per new protocols pnv needs to have folic acid.

## 2021-07-28 ENCOUNTER — Encounter: Payer: Managed Care, Other (non HMO) | Admitting: Obstetrics and Gynecology

## 2021-07-28 ENCOUNTER — Other Ambulatory Visit: Payer: Managed Care, Other (non HMO)

## 2021-07-29 NOTE — Telephone Encounter (Signed)
Left detailed msg for pt to call and let us know if she still need pnv; if breastfeeding she still needs them.

## 2021-07-29 NOTE — Progress Notes (Deleted)
    OBSTETRICS/GYNECOLOGY POST-OPERATIVE CLINIC VISIT  Subjective:     Nancy Strong is a 31 y.o. female who presents to the clinic  1.3  weeks status post CESAREAN SECTION for  Delivery . Eating a regular diet {with-without:5700} difficulty. Bowel movements are {normal/abnormal***:19619}. {pain control:13522::"The patient is not having any pain."}  {Common ambulatory SmartLinks:19316}  Review of Systems {ros; complete:30496}   Objective:   There were no vitals taken for this visit. There is no height or weight on file to calculate BMI.  General:  alert and no distress  Abdomen: soft, bowel sounds active, non-tender  Incision:   {incision:13716::"no dehiscence","incision well approximated","healing well","no drainage","no erythema","no hernia","no seroma","no swelling"}    Pathology:    Assessment:   Patient s/p CESAREAN SECTION (surgery)  {doing well:13525::"Doing well postoperatively."}   Plan:   1. Continue any current medications as instructed by provider. 2. Wound care discussed. 3. Operative findings again reviewed. Pathology report discussed. 4. Activity restrictions: {restrictions:13723} 5. Anticipated return to work: {work return:14002}. 6. Follow up: [id number] {time; units:18646} for ***    Rubie Maid, MD [facility]

## 2021-08-03 ENCOUNTER — Encounter: Payer: Managed Care, Other (non HMO) | Admitting: Obstetrics and Gynecology

## 2021-08-03 ENCOUNTER — Telehealth: Payer: Self-pay | Admitting: Obstetrics and Gynecology

## 2021-08-03 NOTE — Telephone Encounter (Signed)
Pt called asking if there are any restrictions- she works from home she is asking if she can return as there is no heavy lifting. Please advise.

## 2021-08-04 ENCOUNTER — Telehealth: Payer: Self-pay | Admitting: Obstetrics and Gynecology

## 2021-08-04 ENCOUNTER — Encounter: Payer: Self-pay | Admitting: Obstetrics and Gynecology

## 2021-08-04 NOTE — Telephone Encounter (Signed)
Called patient to inform her that there are no restrictions for her to return to work since she is working from home. She verbalized understanding.

## 2021-08-04 NOTE — Telephone Encounter (Signed)
Pt called and stated that she called earlier and sent a message to provider in reference to her bandage coming off at her incision sight for her c-section. Patient is concerned and wondering if she can leave it unwrapped or does she need to re bandage the site. Patient was scheduled for an appointment on 5/30 for incision check that she was unable to make and has rescheduled. Please advise

## 2021-08-04 NOTE — Telephone Encounter (Signed)
No, it's fine. She can remove it.

## 2021-08-04 NOTE — Telephone Encounter (Signed)
Pt called stating that she is on week 2 of post op c/section and her bandage is coming off she wants to know if she needs to redress it.

## 2021-08-05 DIAGNOSIS — Z419 Encounter for procedure for purposes other than remedying health state, unspecified: Secondary | ICD-10-CM | POA: Diagnosis not present

## 2021-08-05 NOTE — Telephone Encounter (Signed)
Responded to patient via mychart

## 2021-08-05 NOTE — Telephone Encounter (Signed)
Sent patient a mychart message to inform her that it is ok to remove her c-section bandages.

## 2021-08-10 NOTE — Telephone Encounter (Signed)
MyChart msg sent to pt.

## 2021-08-19 ENCOUNTER — Encounter: Payer: Self-pay | Admitting: Obstetrics and Gynecology

## 2021-08-19 ENCOUNTER — Ambulatory Visit (INDEPENDENT_AMBULATORY_CARE_PROVIDER_SITE_OTHER): Payer: Managed Care, Other (non HMO) | Admitting: Obstetrics and Gynecology

## 2021-08-19 VITALS — BP 147/92 | HR 96 | Ht 61.0 in | Wt 128.4 lb

## 2021-08-19 DIAGNOSIS — O900 Disruption of cesarean delivery wound: Secondary | ICD-10-CM

## 2021-08-19 DIAGNOSIS — T8131XA Disruption of external operation (surgical) wound, not elsewhere classified, initial encounter: Secondary | ICD-10-CM

## 2021-08-19 DIAGNOSIS — Z4889 Encounter for other specified surgical aftercare: Secondary | ICD-10-CM

## 2021-08-19 DIAGNOSIS — Z98891 History of uterine scar from previous surgery: Secondary | ICD-10-CM

## 2021-08-19 NOTE — Progress Notes (Deleted)
Postpartum Day # 25: Cesarean Delivery on 07/24/2021  Subjective: Patient reports some abdominal cramping that comes and goes.    Objective: Vital signs in last 24 hours: @VSRANGES @  Physical Exam:  General: alert, cooperative, and no distress Lungs: {lung exam:16931} Breasts: {breast exam:13139::"normal appearance, no masses or tenderness"} Heart: {heart exam:5510} Abdomen: {abdominal exam:16834} Pelvis: Lochia {Desc; appropriate/inappropriate:30686::"appropriate"}, Uterine Fundus {Desc; firm/soft:30687}, Incision: {Exam; incision:13523} Extremities: DVT Evaluation: {Exam; dvt:13533}  No results for input(s): "HGB", "HCT" in the last 72 hours.  Assessment/Plan: Status post Cesarean section. {Condition:13525::"Doing well postoperatively."}  {assessment/plan:3041427} Advance diet Continue PO pain management Remove foley catheter Discontinue IVF {continue current care/discharge:30691}.  [facility]

## 2021-08-19 NOTE — Progress Notes (Unsigned)
    OBSTETRICS/GYNECOLOGY POST-OPERATIVE CLINIC VISIT  Subjective:     Nancy Strong is a 31 y.o. female who presents to the clinic {1-10:13787} weeks status post  incision check  for  cesarean . Eating a regular diet without difficulty. Bowel movements are normal. The patient is not having any pain.  The following portions of the patient's history were reviewed and updated as appropriate: allergies, current medications, past family history, past medical history, past social history, past surgical history, and problem list.  Review of Systems Pertinent items are noted in HPI.   Objective:   BP (!) 147/95   Pulse 96   Ht 5\' 1"  (1.549 m)   Wt 128 lb 6.4 oz (58.2 kg)   Breastfeeding Yes   BMI 24.26 kg/m  Body mass index is 24.26 kg/m.  General:  alert and no distress  Abdomen: soft, bowel sounds active, non-tender  Incision:   Left lateral egde of incision with skin separation, 0.5 cm. {incision:13716::"no dehiscence","incision well approximated","healing well","no drainage","no erythema","no hernia","no seroma","no swelling"}    Pathology:    Assessment:   Patient s/p *** (surgery)  {doing well:13525::"Doing well postoperatively."}   Plan:   1. Continue any current medications as instructed by provider. 2. Wound care discussed. 3. Operative findings again reviewed. Pathology report discussed. 4. Activity restrictions: {restrictions:13723} 5. Anticipated return to work: {work return:14002}. 6. Follow up: {time; units:18646} for ***   Steri strips and benzoine place.d   {8-56:31497}, CMA [facility]

## 2021-09-04 DIAGNOSIS — Z419 Encounter for procedure for purposes other than remedying health state, unspecified: Secondary | ICD-10-CM | POA: Diagnosis not present

## 2021-09-08 ENCOUNTER — Encounter: Payer: Managed Care, Other (non HMO) | Admitting: Obstetrics and Gynecology

## 2021-09-08 NOTE — Progress Notes (Deleted)
   OBSTETRICS POSTPARTUM CLINIC PROGRESS NOTE  Subjective:     Nancy Strong is a 31 y.o. [id number] female who presents for a postpartum visit. She is 6 week postpartum following a low cervical transverse Cesarean section. I have fully reviewed the prenatal and intrapartum course. The delivery was at 39.5 gestational weeks.  Anesthesia: spinal. Postpartum course has been ***. Baby's course has been ***. Baby is feeding by breast. Bleeding: patient {HAS HAS YQI:34742} not resumed menses, with No LMP recorded.. Bowel function is {normal:32111}. Bladder function is {normal:32111}. Patient {is/is not:9024} sexually active. Contraception method desired is {contraceptive method:5051}. Postpartum depression screening: {neg default:13464::"negative"}.  EDPS score is ***.    The following portions of the patient's history were reviewed and updated as appropriate: allergies, current medications, past family history, past medical history, past social history, past surgical history, and problem list.  Review of Systems {ros; complete:30496}   Objective:    There were no vitals taken for this visit.  General:  alert and no distress   Breasts:  inspection negative, no nipple discharge or bleeding, no masses or nodularity palpable  Lungs: clear to auscultation bilaterally  Heart:  regular rate and rhythm, S1, S2 normal, no murmur, click, rub or gallop  Abdomen: soft, non-tender; bowel sounds normal; no masses,  no organomegaly.  ***Well healed Pfannenstiel incision   Vulva:  normal  Vagina: normal vagina, no discharge, exudate, lesion, or erythema  Cervix:  no cervical motion tenderness and no lesions  Corpus: normal size, contour, position, consistency, mobility, non-tender  Adnexa:  normal adnexa and no mass, fullness, tenderness  Rectal Exam: Not performed.         Labs:  Lab Results  Component Value Date   HGB 7.3 (L) 07/26/2021     Assessment:   No diagnosis found.   Plan:    1.  Contraception: {method:5051} 2. Will check Hgb for h/o postpartum anemia of less than 10.  3. Follow up in:  3-4  months or as needed.    Hildred Laser, MD [facility]

## 2021-09-14 IMAGING — US US OB < 14 WEEKS - US OB TV
1 series · 15 of 28 positions shown · non-contrast
Comparison: None.

CLINICAL DATA: Vaginal bleeding since intercourse yesterday.

EXAM:
OBSTETRIC <14 WK US AND TRANSVAGINAL OB US
TECHNIQUE: Both transabdominal and transvaginal ultrasound examinations were
performed for complete evaluation of the gestation as well as the
maternal uterus, adnexal regions, and pelvic cul-de-sac.
Transvaginal technique was performed to assess for early pregnancy.

[Series 1: us ob < 14 weeks - us ob tv · 15 of 44 slices shown]
[im 1/44]
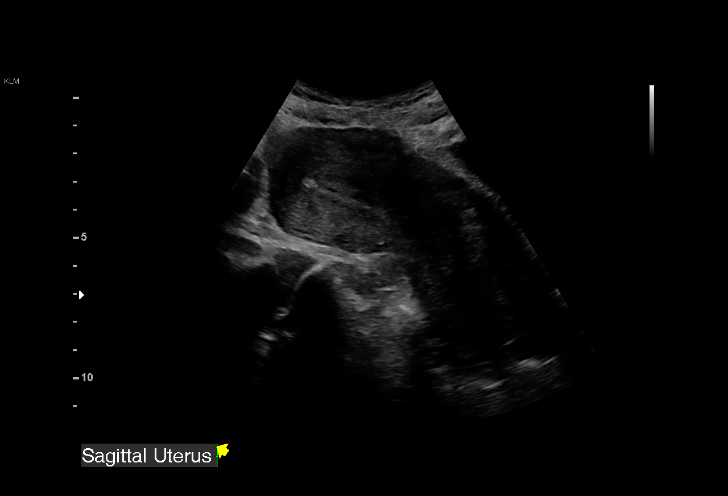
[im 4/44]
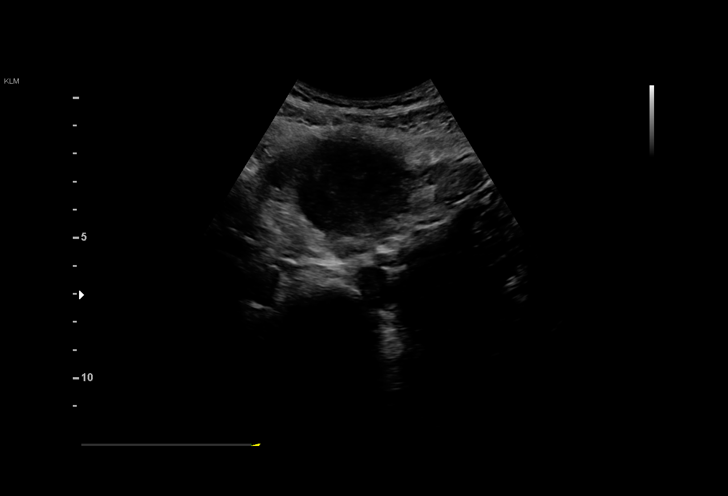
[im 7/44]
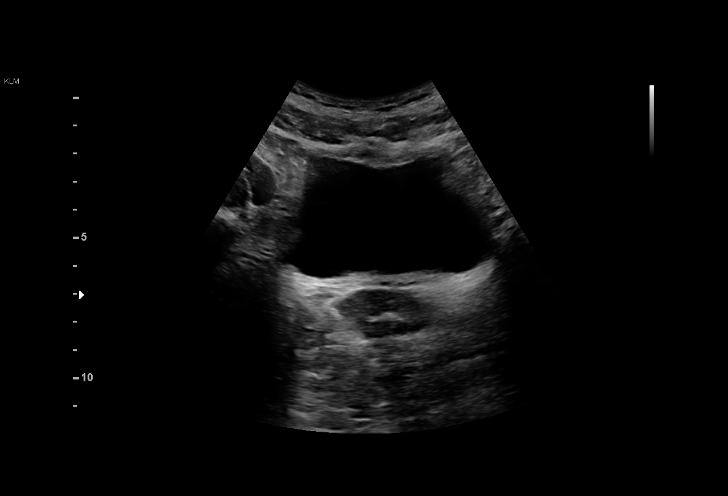
[im 10/44]
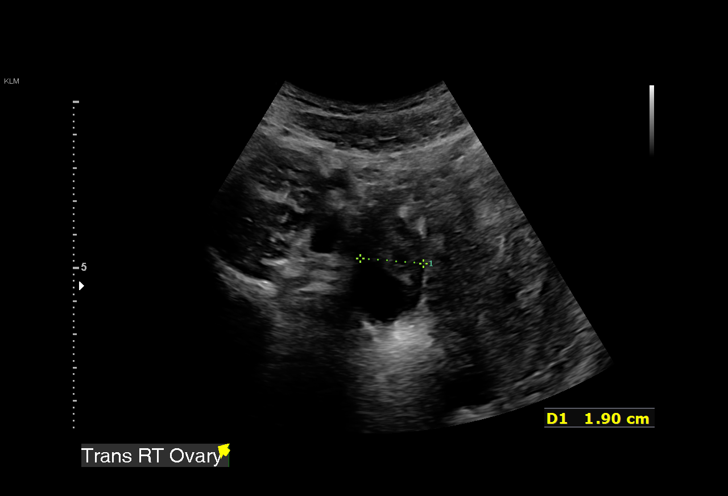
[im 13/44]
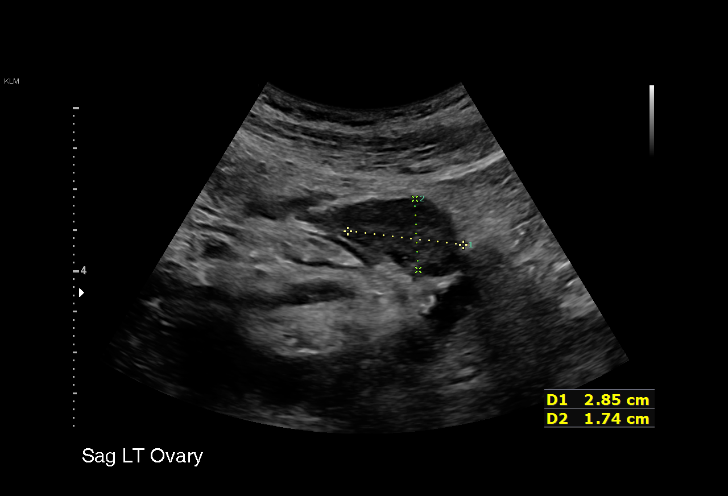
[im 16/44]
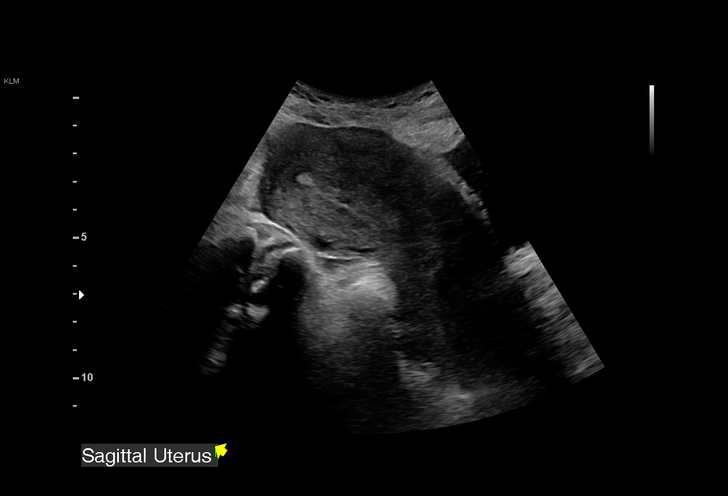
[im 20/44]
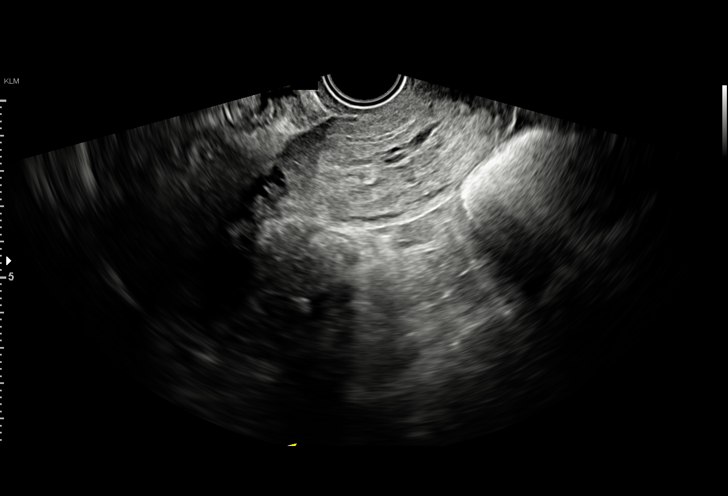
[im 23/44]
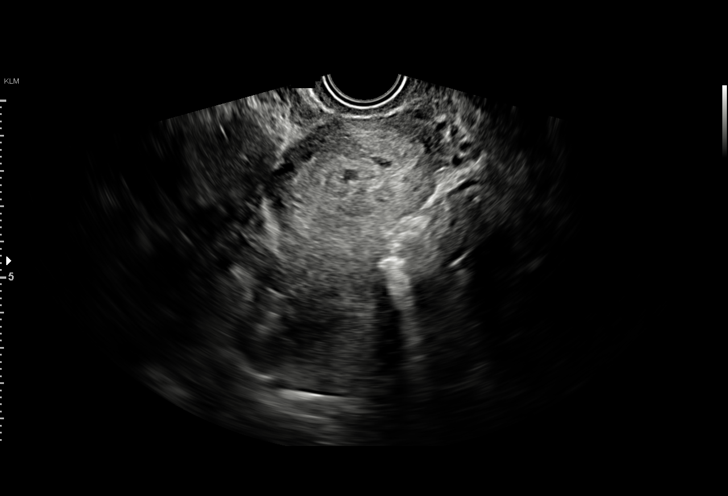
[im 24/44]
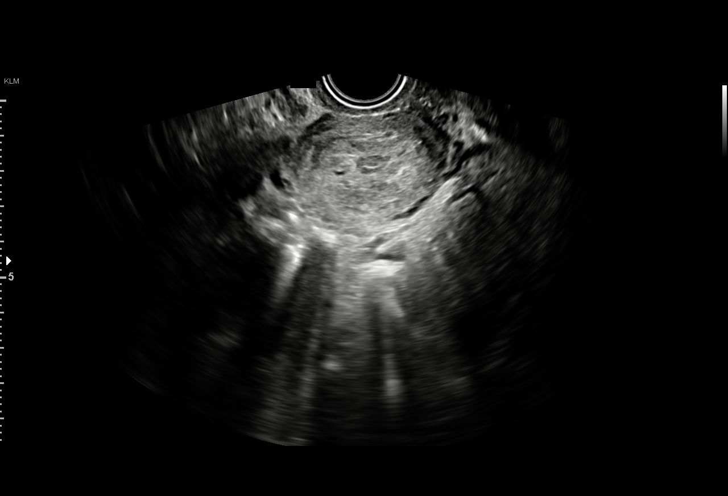
[im 28/44]
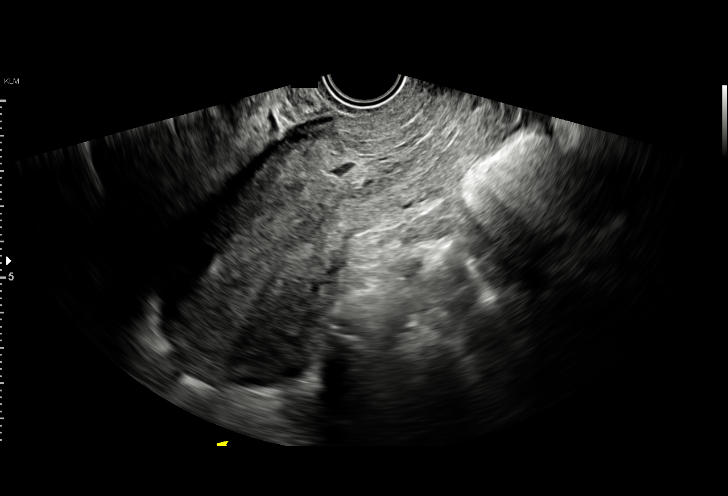
[im 31/44]
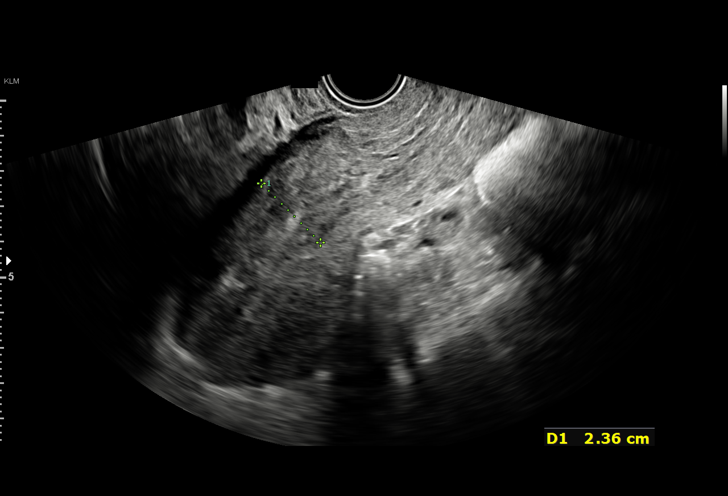
[im 34/44]
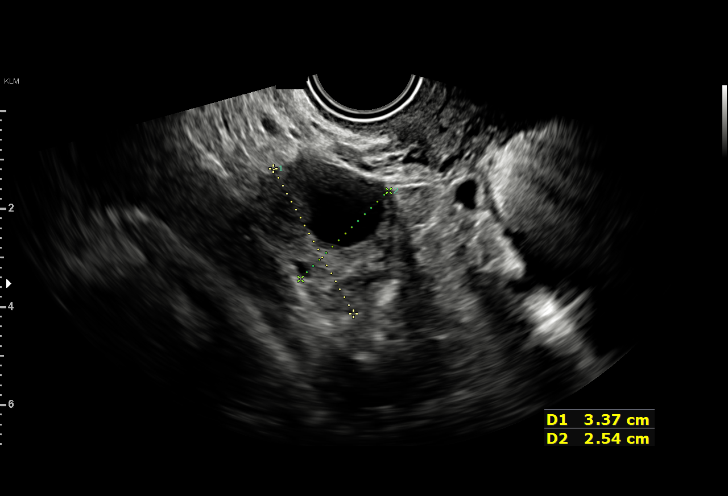
[im 37/44]
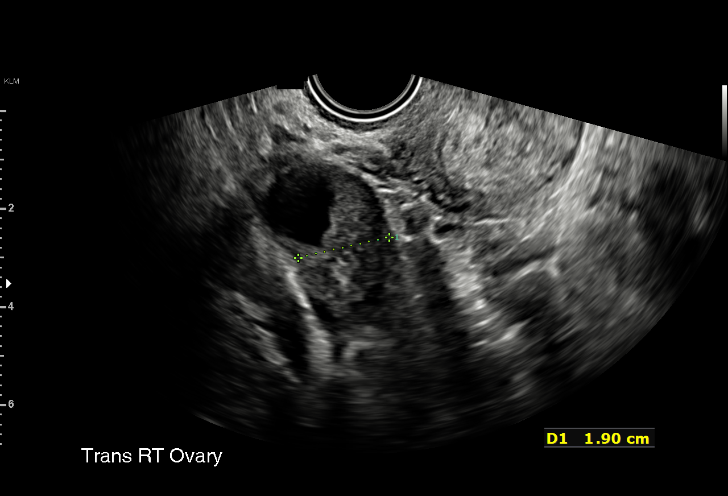
[im 40/44]
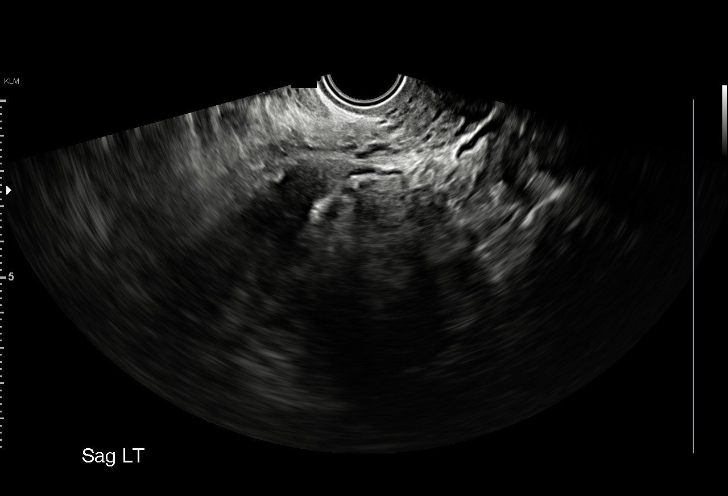
[im 44/44]
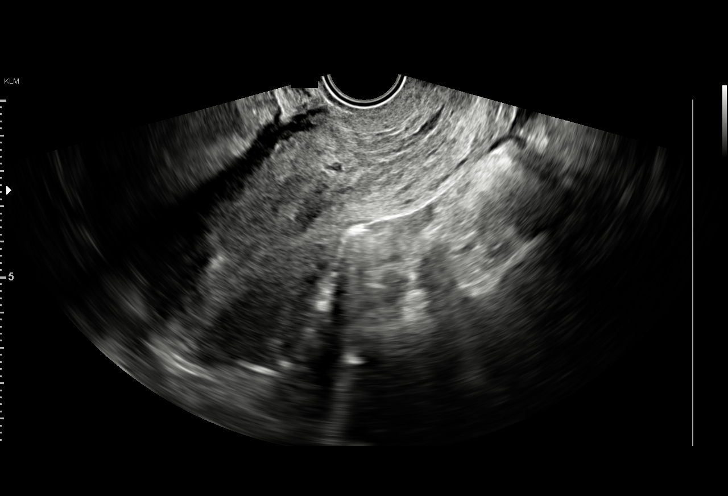

[15 of 28 positions shown; findings below may reference images not displayed]

FINDINGS: Intrauterine gestational sac: None

Yolk sac:  None

Embryo:  None

Maternal uterus/adnexae: Some thickening of the endometrium. Both
ovaries appear normal with the right measuring 3.2 x 3.2 x 1.9 cm in
the left measuring 2.9 x 1.7 x 2.0 cm. Normal appearing follicular
cyst. No free fluid.
IMPRESSION: No finding of intrauterine or ectopic pregnancy. Normal appearing
uterus. Slightly prominent endometrium which may be physiologic.
Normal appearing ovaries.

## 2021-10-05 DIAGNOSIS — Z419 Encounter for procedure for purposes other than remedying health state, unspecified: Secondary | ICD-10-CM | POA: Diagnosis not present

## 2021-10-12 NOTE — Progress Notes (Deleted)
    GYNECOLOGY PROGRESS NOTE  Subjective:    Patient ID: Nancy Strong, female    DOB: [birth year], 31 y.o.   MRN: [id number]  HPI  Patient is a 31 y.o. [id number] female who presents to discuss contraceptive options.  {Common ambulatory SmartLinks:19316}  Review of Systems {ros; complete:30496}   Objective:   currently breastfeeding. There is no height or weight on file to calculate BMI. General appearance: {general exam:16600} Abdomen: {abdominal exam:16834} Pelvic: {pelvic exam:16852::"cervix normal in appearance","external genitalia normal","no adnexal masses or tenderness","no cervical motion tenderness","rectovaginal septum normal","uterus normal size, shape, and consistency","vagina normal without discharge"} Extremities: {extremity exam:5109} Neurologic: {neuro exam:17854}   Assessment:   No diagnosis found.   Plan:   There are no diagnoses linked to this encounter.   Hildred Laser, MD [facility]

## 2021-10-13 ENCOUNTER — Encounter: Payer: Managed Care, Other (non HMO) | Admitting: Obstetrics and Gynecology

## 2021-11-05 DIAGNOSIS — Z419 Encounter for procedure for purposes other than remedying health state, unspecified: Secondary | ICD-10-CM | POA: Diagnosis not present

## 2021-12-05 DIAGNOSIS — Z419 Encounter for procedure for purposes other than remedying health state, unspecified: Secondary | ICD-10-CM | POA: Diagnosis not present

## 2022-01-05 DIAGNOSIS — Z419 Encounter for procedure for purposes other than remedying health state, unspecified: Secondary | ICD-10-CM | POA: Diagnosis not present

## 2022-02-04 DIAGNOSIS — Z419 Encounter for procedure for purposes other than remedying health state, unspecified: Secondary | ICD-10-CM | POA: Diagnosis not present

## 2022-03-07 DIAGNOSIS — Z419 Encounter for procedure for purposes other than remedying health state, unspecified: Secondary | ICD-10-CM | POA: Diagnosis not present

## 2022-04-07 DIAGNOSIS — Z419 Encounter for procedure for purposes other than remedying health state, unspecified: Secondary | ICD-10-CM | POA: Diagnosis not present

## 2022-05-06 DIAGNOSIS — Z419 Encounter for procedure for purposes other than remedying health state, unspecified: Secondary | ICD-10-CM | POA: Diagnosis not present

## 2022-06-06 DIAGNOSIS — Z419 Encounter for procedure for purposes other than remedying health state, unspecified: Secondary | ICD-10-CM | POA: Diagnosis not present

## 2022-06-13 ENCOUNTER — Other Ambulatory Visit: Payer: Self-pay

## 2022-06-13 ENCOUNTER — Emergency Department
Admission: EM | Admit: 2022-06-13 | Discharge: 2022-06-13 | Disposition: A | Discharge status: Home / Self Care | Payer: Medicaid Other | Attending: Emergency Medicine | Admitting: Emergency Medicine

## 2022-06-13 ENCOUNTER — Emergency Department: Payer: Medicaid Other

## 2022-06-13 DIAGNOSIS — A749 Chlamydial infection, unspecified: Secondary | ICD-10-CM

## 2022-06-13 DIAGNOSIS — Z3A01 Less than 8 weeks gestation of pregnancy: Secondary | ICD-10-CM | POA: Insufficient documentation

## 2022-06-13 DIAGNOSIS — O98311 Other infections with a predominantly sexual mode of transmission complicating pregnancy, first trimester: Secondary | ICD-10-CM | POA: Insufficient documentation

## 2022-06-13 DIAGNOSIS — O209 Hemorrhage in early pregnancy, unspecified: Secondary | ICD-10-CM | POA: Diagnosis present

## 2022-06-13 DIAGNOSIS — O200 Threatened abortion: Secondary | ICD-10-CM | POA: Diagnosis not present

## 2022-06-13 DIAGNOSIS — O4690 Antepartum hemorrhage, unspecified, unspecified trimester: Secondary | ICD-10-CM

## 2022-06-13 LAB — BASIC METABOLIC PANEL
Anion gap: 5 (ref 5–15)
BUN: 10 mg/dL (ref 6–20)
CO2: 23 mmol/L (ref 22–32)
Calcium: 9 mg/dL (ref 8.9–10.3)
Chloride: 105 mmol/L (ref 98–111)
Creatinine, Ser: 0.79 mg/dL (ref 0.44–1.00)
GFR, Estimated: >60 mL/min (ref >60)
Glucose, Bld: 99 mg/dL (ref 70–99)
Potassium: 3.7 mmol/L (ref 3.5–5.1)
Sodium: 133 mmol/L — ABNORMAL LOW (ref 135–145)

## 2022-06-13 LAB — WET PREP, GENITAL
Clue Cells Wet Prep HPF POC: NONE SEEN
Sperm: NONE SEEN
Trich, Wet Prep: NONE SEEN
WBC, Wet Prep HPF POC: >=10 — AB (ref <10)
Yeast Wet Prep HPF POC: NONE SEEN

## 2022-06-13 LAB — CBC
HCT: 33.5 % — ABNORMAL LOW (ref 36.0–46.0)
Hemoglobin: 11.4 g/dL — ABNORMAL LOW (ref 12.0–15.0)
MCH: 29.9 pg (ref 26.0–34.0)
MCHC: 34 g/dL (ref 30.0–36.0)
MCV: 87.9 fL (ref 80.0–100.0)
Platelets: 225 10*3/uL (ref 150–400)
RBC: 3.81 MIL/uL — ABNORMAL LOW (ref 3.87–5.11)
RDW: 12.7 % (ref 11.5–15.5)
WBC: 9.2 10*3/uL (ref 4.0–10.5)
nRBC: 0 % (ref 0.0–0.2)

## 2022-06-13 LAB — TYPE AND SCREEN
ABO/RH(D): A NEG
Antibody Screen: NEGATIVE

## 2022-06-13 LAB — CHLAMYDIA/NGC RT PCR (ARMC ONLY)
Chlamydia Tr: DETECTED — AB
N gonorrhoeae: NOT DETECTED

## 2022-06-13 LAB — HCG, QUANTITATIVE, PREGNANCY: hCG, Beta Chain, Quant, S: >250000 m[IU]/mL — ABNORMAL HIGH (ref <5)

## 2022-06-13 MED ORDER — RHO D IMMUNE GLOBULIN 1500 UNIT/2ML IJ SOSY
300.0000 ug | PREFILLED_SYRINGE | Freq: Once | INTRAMUSCULAR | Status: AC
  Administered 2022-06-13: 300 ug via INTRAMUSCULAR
  Filled 2022-06-13: qty 2

## 2022-06-13 MED ORDER — CEFTRIAXONE SODIUM 1 G IJ SOLR
500.0000 mg | Freq: Once | INTRAMUSCULAR | Status: AC
  Administered 2022-06-13: 500 mg via INTRAMUSCULAR
  Filled 2022-06-13: qty 10

## 2022-06-13 MED ORDER — LIDOCAINE HCL (PF) 1 % IJ SOLN
1.0000 mL | Freq: Once | INTRAMUSCULAR | Status: AC
  Administered 2022-06-13: 2.1 mL
  Filled 2022-06-13: qty 5

## 2022-06-13 MED ORDER — AZITHROMYCIN 500 MG PO TABS
1000.0000 mg | ORAL_TABLET | Freq: Once | ORAL | Status: AC
  Administered 2022-06-13: 1000 mg via ORAL
  Filled 2022-06-13: qty 2

## 2022-06-13 NOTE — ED Triage Notes (Signed)
Pt to ED via POV from home. Pt reports mild vaginal bleeding and abdominal discomfort that started today. Pt attempted to be seen by OBGYN but was unsuccessful. Pt reports 3rd pregnancy with 2 living children. Pregnancy has not been confirmed at [facility] office yet.

## 2022-06-13 NOTE — ED Provider Notes (Signed)
c  [facility] Provider Note    Event Date/Time   First MD Initiated Contact with Patient 06/13/22 1836     (approximate)   History   Abdominal Pain ([redacted]wks pregnant ) and Vaginal Bleeding   HPI  Nancy Strong is a 32 y.o. female G7P2 presents to the emergency department with abdominal pain and vaginal bleeding.  Patient states that based on LMP is approximately [redacted] weeks gestational age.  Home pregnancy test that was positive.  Patient has had 3 prior ectopic pregnancies and 1 prior miscarriage.  2 prior C-sections.  Abdominal pain and vaginal bleeding that started today.  Vaginal bleeding has since resolved.  Denies any nausea or vomiting.  Not on anticoagulation.  Denies any burning with urination.  Believes that she received RhoGAM in prior pregnancies.     Physical Exam   Triage Vital Signs: ED Triage Vitals  Enc Vitals Group     BP 06/13/22 1741 121/78     Pulse Rate 06/13/22 1741 91     Resp 06/13/22 1741 18     Temp 06/13/22 1741 98 F (36.7 C)     Temp Source 06/13/22 1741 Oral     SpO2 06/13/22 1741 100 %     Weight --      Height --      Head Circumference --      Peak Flow --      Pain Score 06/13/22 1740 2     Pain Loc --      Pain Edu? --      Excl. in GC? --     Most recent vital signs: Vitals:   06/13/22 1741  BP: 121/78  Pulse: 91  Resp: 18  Temp: 98 F (36.7 C)  SpO2: 100%    Physical Exam Constitutional:      Appearance: She is well-developed.  HENT:     Head: Atraumatic.  Eyes:     Conjunctiva/sclera: Conjunctivae normal.  Cardiovascular:     Rate and Rhythm: Regular rhythm.  Pulmonary:     Effort: No respiratory distress.  Abdominal:     General: There is no distension.     Tenderness: There is abdominal tenderness in the right lower quadrant and left lower quadrant.  Genitourinary:    Cervix: No cervical motion tenderness.     Adnexa: Right adnexa normal and left adnexa normal.     Comments: Closed  os, no active bleeding.  Musculoskeletal:        General: Normal range of motion.     Cervical back: Normal range of motion.  Skin:    General: Skin is warm.  Neurological:     Mental Status: She is alert. Mental status is at baseline.      IMPRESSION / MDM / ASSESSMENT AND PLAN / ED COURSE  I reviewed the triage vital signs and the nursing notes.  Ddx of ectopic pregnancy, miscarriage, IUP.  Patient without active bleeding at this time.  Will obtain lab work and ABO.     RADIOLOGY I independently reviewed imaging, my interpretation of imaging: Transvaginal ultrasound -IUP with good heart rate.   Labs (all labs ordered are listed, but only abnormal results are displayed) Labs interpreted as -    Labs Reviewed  CHLAMYDIA/NGC RT PCR ([facility] ONLY)           - Abnormal; Notable for the following components:      Result Value   Chlamydia Tr DETECTED (*)  All other components within normal limits  WET PREP, GENITAL - Abnormal; Notable for the following components:   WBC, Wet Prep HPF POC >=10 (*)    All other components within normal limits  CBC - Abnormal; Notable for the following components:   RBC 3.81 (*)    Hemoglobin 11.4 (*)    HCT 33.5 (*)    All other components within normal limits  BASIC METABOLIC PANEL - Abnormal; Notable for the following components:   Sodium 133 (*)    All other components within normal limits  HCG, QUANTITATIVE, PREGNANCY - Abnormal; Notable for the following components:   hCG, Beta Chain, Quant, S >250,000 (*)    All other components within normal limits  POC URINE PREG, ED  TYPE AND SCREEN  RHOGAM INJECTION    Patient with IUP.  Patient clinical picture consistent with threatened miscarriage.  Does have a subchorionic hematoma onto her ultrasound.  Has a closed cervical os in the emergency department.  No ongoing active bleeding.  Clinical picture is not consistent with ectopic or heterotopic pregnancy.  Given her Rh- ordered  RhoGAM.  Positive for chlamydia.  Given IM Rocephin and p.o. azithromycin given her pregnancy.  Discussed treatment of partners and cessation of sexual intercourse until our partners are treated.  Discussed follow-up with her obstetrician.  Given return precautions for any worsening abdominal pain or bleeding.     PROCEDURES:  Critical Care performed: No  Procedures  Patient's presentation is most consistent with acute presentation with potential threat to life or bodily function.   MEDICATIONS ORDERED IN ED: Medications  rho (d) immune globulin (RHIG/RHOPHYLAC) injection 300 mcg (has no administration in time range)  cefTRIAXone (ROCEPHIN) injection 500 mg (500 mg Intramuscular Given 06/13/22 2257)  lidocaine (PF) (XYLOCAINE) 1 % injection 1-2.1 mL (2.1 mLs Other Given 06/13/22 2302)  azithromycin (ZITHROMAX) tablet 1,000 mg (1,000 mg Oral Given 06/13/22 2256)    FINAL CLINICAL IMPRESSION(S) / ED DIAGNOSES   Final diagnoses:  Vaginal bleeding in pregnancy  Threatened miscarriage  Chlamydia     Rx / DC Orders   ED Discharge Orders     None        Note:  This document was prepared using Dragon voice recognition software and may include unintentional dictation errors.   Corena Herter, MD 06/13/22 2315

## 2022-06-14 LAB — RHOGAM INJECTION: Unit division: 0

## 2022-06-24 ENCOUNTER — Ambulatory Visit (INDEPENDENT_AMBULATORY_CARE_PROVIDER_SITE_OTHER): Payer: Medicaid Other

## 2022-06-24 VITALS — Wt 127.0 lb

## 2022-06-24 DIAGNOSIS — Z369 Encounter for antenatal screening, unspecified: Secondary | ICD-10-CM

## 2022-06-24 DIAGNOSIS — Z130 Encounter for screening for diseases of the blood and blood-forming organs and certain disorders involving the immune mechanism: Secondary | ICD-10-CM

## 2022-06-24 DIAGNOSIS — Z3689 Encounter for other specified antenatal screening: Secondary | ICD-10-CM

## 2022-06-24 DIAGNOSIS — Z3480 Encounter for supervision of other normal pregnancy, unspecified trimester: Secondary | ICD-10-CM | POA: Insufficient documentation

## 2022-06-24 NOTE — Progress Notes (Signed)
New OB Intake  I connected with  Nancy Strong on 06/24/22 at  3:15 PM EDT by telephone and verified that I am speaking with the correct person using two identifiers. Nurse is located at [facility] and pt is located at home.  I explained I am completing New OB Intake today. We discussed her EDD of 01/14/2023 that is based on LMP of 04/09/2022. Pt is G7/P1042. I reviewed her allergies, medications, Medical/Surgical/OB history, and appropriate screenings. There are no cats in the home.  Based on history, this is a/an pregnancy uncomplicated .   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  Anatomy US Explained first scheduled [place] will bean anatomy scan at 20 weeks.  Labs Discussed genetic screening with patient. Patient declines genetic testing. Discussed possible labs to be drawn at new OB appointment.  COVID Vaccine Patient has not had COVID vaccine.   Social Determinants of Health Food Insecurity: denies food insecurity Transportation: Patient expressed transportation needs. Childcare: Discussed no children allowed at ultrasound appointments.   First visit review I reviewed new OB appt with pt. I explained she will have ob bloodwork and pap smear/pelvic exam if indicated. Explained pt will be seen by Carie Caddy, CNM at first visit; encounter routed to appropriate provider.   Loran Senters, [facility] 06/24/2022  3:44 PM

## 2022-06-24 NOTE — Patient Instructions (Signed)
Second Trimester of Pregnancy  The second trimester of pregnancy is from week 13 through week 27. This is months 4 through 6 of pregnancy. The second trimester is often a time when you feel your best. Your body has adjusted to being pregnant, and you begin to feel better physically. During the second trimester: Morning sickness has lessened or stopped completely. You may have more energy. You may have an increase in appetite. The second trimester is also a time when the unborn baby (fetus) is growing rapidly. At the end of the sixth month, the fetus may be up to 12 inches long and weigh about 1 pounds. You will likely begin to feel the baby move (quickening) between 16 and 20 weeks of pregnancy. Body changes during your second trimester Your body continues to go through many changes during your second trimester. The changes vary and generally return to normal after the baby is born. Physical changes Your weight will continue to increase. You will notice your lower abdomen bulging out. You may begin to get stretch marks on your hips, abdomen, and breasts. Your breasts will continue to grow and to become tender. Dark spots or blotches (chloasma or mask of pregnancy) may develop on your face. A dark line from your belly button to the pubic area (linea nigra) may appear. You may have changes in your hair. These can include thickening of your hair, rapid growth, and changes in texture. Some people also have hair loss during or after pregnancy, or hair that feels dry or thin. Health changes You may develop headaches. You may have heartburn. You may develop constipation. You may develop hemorrhoids or swollen, bulging veins (varicose veins). Your gums may bleed and may be sensitive to brushing and flossing. You may urinate more often because the fetus is pressing on your bladder. You may have back pain. This is caused by: Weight gain. Pregnancy hormones that are relaxing the joints in your  pelvis. A shift in weight and the muscles that support your balance. Follow these instructions at home: Medicines Follow your health care provider's instructions regarding medicine use. Specific medicines may be either safe or unsafe to take during pregnancy. Do not take any medicines unless approved by your health care provider. Take a prenatal vitamin that contains at least 600 micrograms (mcg) of folic acid. Eating and drinking Eat a healthy diet that includes fresh fruits and vegetables, whole grains, good sources of protein such as meat, eggs, or tofu, and low-fat dairy products. Avoid raw meat and unpasteurized juice, milk, and cheese. These carry germs that can harm you and your baby. You may need to take these actions to prevent or treat constipation: Drink enough fluid to keep your urine pale yellow. Eat foods that are high in fiber, such as beans, whole grains, and fresh fruits and vegetables. Limit foods that are high in fat and processed sugars, such as fried or sweet foods. Activity Exercise only as directed by your health care provider. Most people can continue their usual exercise routine during pregnancy. Try to exercise for 30 minutes at least 5 days a week. Stop exercising if you develop contractions in your uterus. Stop exercising if you develop pain or cramping in the lower abdomen or lower back. Avoid exercising if it is very hot or humid or if you are at a high altitude. Avoid heavy lifting. If you choose to, you may have sex unless your health care provider tells you not to. Relieving pain and discomfort Wear a supportive   bra to prevent discomfort from breast tenderness. Take warm sitz baths to soothe any pain or discomfort caused by hemorrhoids. Use hemorrhoid cream if your health care provider approves. Rest with your legs raised (elevated) if you have leg cramps or low back pain. If you develop varicose veins: Wear support hose as told by your health care  provider. Elevate your feet for 15 minutes, 3-4 times a day. Limit salt in your diet. Safety Wear your seat belt at all times when driving or riding in a car. Talk with your health care provider if someone is verbally or physically abusive to you. Lifestyle Do not use hot tubs, steam rooms, or saunas. Do not douche. Do not use tampons or scented sanitary pads. Avoid cat litter boxes and soil used by cats. These carry germs that can cause birth defects in the baby and possibly loss of the fetus by miscarriage or stillbirth. Do not use herbal remedies, alcohol, illegal drugs, or medicines that are not approved by your health care provider. Chemicals in these products can harm your baby. Do not use any products that contain nicotine or tobacco, such as cigarettes, e-cigarettes, and chewing tobacco. If you need help quitting, ask your health care provider. General instructions During a routine prenatal visit, your health care provider will do a physical exam and other tests. He or she will also discuss your overall health. Keep all follow-up visits. This is important. Ask your health care provider for a referral to a local prenatal education class. Ask for help if you have counseling or nutritional needs during pregnancy. Your health care provider can offer advice or refer you to specialists for help with various needs. Where to find more information American Pregnancy Association: americanpregnancy.org American College of Obstetricians and Gynecologists: acog.org/en/Womens%20Health/Pregnancy Office on Women's Health: womenshealth.gov/pregnancy Contact a health care provider if you have: A headache that does not go away when you take medicine. Vision changes or you see spots in front of your eyes. Mild pelvic cramps, pelvic pressure, or nagging pain in the abdominal area. Persistent nausea, vomiting, or diarrhea. A bad-smelling vaginal discharge or foul-smelling urine. Pain when you  urinate. Sudden or extreme swelling of your face, hands, ankles, feet, or legs. A fever. Get help right away if you: Have fluid leaking from your vagina. Have spotting or bleeding from your vagina. Have severe abdominal cramping or pain. Have difficulty breathing. Have chest pain. Have fainting spells. Have not felt your baby move for the time period told by your health care provider. Have new or increased pain, swelling, or redness in an arm or leg. Summary The second trimester of pregnancy is from week 13 through week 27 (months 4 through 6). Do not use herbal remedies, alcohol, illegal drugs, or medicines that are not approved by your health care provider. Chemicals in these products can harm your baby. Exercise only as directed by your health care provider. Most people can continue their usual exercise routine during pregnancy. Keep all follow-up visits. This is important. This information is not intended to replace advice given to you by your health care provider. Make sure you discuss any questions you have with your health care provider. Document Revised: 07/31/2019 Document Reviewed: 06/06/2019 Elsevier Patient Education  2023 [employer] First Trimester of Pregnancy  The first trimester of pregnancy starts on the first day of your last menstrual period until the end of week 12. This is also called months 1 through 3 of pregnancy. Body changes during your first trimester Your   body goes through many changes during pregnancy. The changes usually return to normal after your baby is born. Physical changes You may gain or lose weight. Your breasts may grow larger and hurt. The area around your nipples may get darker. Dark spots or blotches may develop on your face. You may have changes in your hair. Health changes You may feel like you might vomit (nauseous), and you may vomit. You may have heartburn. You may have headaches. You may have trouble pooping (constipation). Your  gums may bleed. Other changes You may get tired easily. You may pee (urinate) more often. Your menstrual periods will stop. You may not feel hungry. You may want to eat certain kinds of food. You may have changes in your emotions from day to day. You may have more dreams. Follow these instructions at home: Medicines Take over-the-counter and prescription medicines only as told by your doctor. Some medicines are not safe during pregnancy. Take a prenatal vitamin that contains at least 600 micrograms (mcg) of folic acid. Eating and drinking Eat healthy meals that include: Fresh fruits and vegetables. Whole grains. Good sources of protein, such as meat, eggs, or tofu. Low-fat dairy products. Avoid raw meat and unpasteurized juice, milk, and cheese. If you feel like you may vomit, or you vomit: Eat 4 or 5 small meals a day instead of 3 large meals. Try eating a few soda crackers. Drink liquids between meals instead of during meals. You may need to take these actions to prevent or treat trouble pooping: Drink enough fluids to keep your pee (urine) pale yellow. Eat foods that are high in fiber. These include beans, whole grains, and fresh fruits and vegetables. Limit foods that are high in fat and sugar. These include fried or sweet foods. Activity Exercise only as told by your doctor. Most people can do their usual exercise routine during pregnancy. Stop exercising if you have cramps or pain in your lower belly (abdomen) or low back. Do not exercise if it is too hot or too humid, or if you are in a place of great height (high altitude). Avoid heavy lifting. If you choose to, you may have sex unless your doctor tells you not to. Relieving pain and discomfort Wear a good support bra if your breasts are sore. Rest with your legs raised (elevated) if you have leg cramps or low back pain. If you have bulging veins (varicose veins) in your legs: Wear support hose as told by your  doctor. Raise your feet for 15 minutes, 3-4 times a day. Limit salt in your food. Safety Wear your seat belt at all times when you are in a car. Talk with your doctor if someone is hurting you or yelling at you. Talk with your doctor if you are feeling sad or have thoughts of hurting yourself. Lifestyle Do not use hot tubs, steam rooms, or saunas. Do not douche. Do not use tampons or scented sanitary pads. Do not use herbal medicines, illegal drugs, or medicines that are not approved by your doctor. Do not drink alcohol. Do not smoke or use any products that contain nicotine or tobacco. If you need help quitting, ask your doctor. Avoid cat litter boxes and soil that is used by cats. These carry germs that can cause harm to the baby and can cause a loss of your baby by miscarriage or stillbirth. General instructions Keep all follow-up visits. This is important. Ask for help if you need counseling or if you need help with   nutrition. Your doctor can give you advice or tell you where to go for help. Visit your dentist. At home, brush your teeth with a soft toothbrush. Floss gently. Write down your questions. Take them to your prenatal visits. Where to find more information American Pregnancy Association: americanpregnancy.org American College of Obstetricians and Gynecologists: www.acog.org Office on Women's Health: womenshealth.gov/pregnancy Contact a doctor if: You are dizzy. You have a fever. You have mild cramps or pressure in your lower belly. You have a nagging pain in your belly area. You continue to feel like you may vomit, you vomit, or you have watery poop (diarrhea) for 24 hours or longer. You have a bad-smelling fluid coming from your vagina. You have pain when you pee. You are exposed to a disease that spreads from person to person, such as chickenpox, measles, Zika virus, HIV, or hepatitis. Get help right away if: You have spotting or bleeding from your vagina. You have  very bad belly cramping or pain. You have shortness of breath or chest pain. You have any kind of injury, such as from a fall or a car crash. You have new or increased pain, swelling, or redness in an arm or leg. Summary The first trimester of pregnancy starts on the first day of your last menstrual period until the end of week 12 (months 1 through 3). Eat 4 or 5 small meals a day instead of 3 large meals. Do not smoke or use any products that contain nicotine or tobacco. If you need help quitting, ask your doctor. Keep all follow-up visits. This information is not intended to replace advice given to you by your health care provider. Make sure you discuss any questions you have with your health care provider. Document Revised: 07/31/2019 Document Reviewed: 06/06/2019 Elsevier Patient Education  2023 Elsevier Inc. Commonly Asked Questions During Pregnancy  Cats: A parasite can be excreted in cat feces.  To avoid exposure you need to have another person empty the little box.  If you must empty the litter box you will need to wear gloves.  Wash your hands after handling your cat.  This parasite can also be found in raw or undercooked meat so this should also be avoided.  Colds, Sore Throats, Flu: Please check your medication sheet to see what you can take for symptoms.  If your symptoms are unrelieved by these medications please call the office.  Dental Work: Most any dental work your dentist recommends is permitted.  X-rays should only be taken during the first trimester if absolutely necessary.  Your abdomen should be shielded with a lead apron during all x-rays.  Please notify your provider prior to receiving any x-rays.  Novocaine is fine; gas is not recommended.  If your dentist requires a note from us prior to dental work please call the office and we will provide one for you.  Exercise: Exercise is an important part of staying healthy during your pregnancy.  You may continue most exercises  you were accustomed to prior to pregnancy.  Later in your pregnancy you will most likely notice you have difficulty with activities requiring balance like riding a bicycle.  It is important that you listen to your body and avoid activities that put you at a higher risk of falling.  Adequate rest and staying well hydrated are a must!  If you have questions about the safety of specific activities ask your provider.    Exposure to Children with illness: Try to avoid obvious exposure; report any   symptoms to us when noted,  If you have chicken pos, red measles or mumps, you should be immune to these diseases.   Please do not take any vaccines while pregnant unless you have checked with your OB provider.  Fetal Movement: After 28 weeks we recommend you do "kick counts" twice daily.  Lie or sit down in a calm quiet environment and count your baby movements "kicks".  You should feel your baby at least 10 times per hour.  If you have not felt 10 kicks within the first hour get up, walk around and have something sweet to eat or drink then repeat for an additional hour.  If count remains less than 10 per hour notify your provider.  Fumigating: Follow your pest control agent's advice as to how long to stay out of your home.  Ventilate the area well before re-entering.  Hemorrhoids:   Most over-the-counter preparations can be used during pregnancy.  Check your medication to see what is safe to use.  It is important to use a stool softener or fiber in your diet and to drink lots of liquids.  If hemorrhoids seem to be getting worse please call the office.   Hot Tubs:  Hot tubs Jacuzzis and saunas are not recommended while pregnant.  These increase your internal body temperature and should be avoided.  Intercourse:  Sexual intercourse is safe during pregnancy as long as you are comfortable, unless otherwise advised by your provider.  Spotting may occur after intercourse; report any bright red bleeding that is heavier  than spotting.  Labor:  If you know that you are in labor, please go to the hospital.  If you are unsure, please call the office and let us help you decide what to do.  Lifting, straining, etc:  If your job requires heavy lifting or straining please check with your provider for any limitations.  Generally, you should not lift items heavier than that you can lift simply with your hands and arms (no back muscles)  Painting:  Paint fumes do not harm your pregnancy, but may make you ill and should be avoided if possible.  Latex or water based paints have less odor than oils.  Use adequate ventilation while painting.  Permanents & Hair Color:  Chemicals in hair dyes are not recommended as they cause increase hair dryness which can increase hair loss during pregnancy.  " Highlighting" and permanents are allowed.  Dye may be absorbed differently and permanents may not hold as well during pregnancy.  Sunbathing:  Use a sunscreen, as skin burns easily during pregnancy.  Drink plenty of fluids; avoid over heating.  Tanning Beds:  Because their possible side effects are still unknown, tanning beds are not recommended.  Ultrasound Scans:  Routine ultrasounds are performed at approximately 20 weeks.  You will be able to see your baby's general anatomy an if you would like to know the gender this can usually be determined as well.  If it is questionable when you conceived you may also receive an ultrasound early in your pregnancy for dating purposes.  Otherwise ultrasound exams are not routinely performed unless there is a medical necessity.  Although you can request a scan we ask that you pay for it when conducted because insurance does not cover " patient request" scans.  Work: If your pregnancy proceeds without complications you may work until your due date, unless your physician or employer advises otherwise.  Round Ligament Pain/Pelvic Discomfort:  Sharp, shooting pains not associated   with bleeding are  fairly common, usually occurring in the second trimester of pregnancy.  They tend to be worse when standing up or when you remain standing for long periods of time.  These are the result of pressure of certain pelvic ligaments called "round ligaments".  Rest, Tylenol and heat seem to be the most effective relief.  As the womb and fetus grow, they rise out of the pelvis and the discomfort improves.  Please notify the office if your pain seems different than that described.  It may represent a more serious condition.  Common Medications Safe in Pregnancy  Acne:      Constipation:  Benzoyl Peroxide     Colace  Clindamycin      Dulcolax Suppository  Topica Erythromycin     Fibercon  Salicylic Acid      Metamucil         Miralax AVOID:        Senakot   Accutane    Cough:  Retin-A       Cough Drops  Tetracycline      Phenergan w/ Codeine if Rx  Minocycline      Robitussin (Plain & DM)  Antibiotics:     Crabs/Lice:  Ceclor       RID  Cephalosporins    AVOID:  E-Mycins      Kwell  Keflex  Macrobid/Macrodantin   Diarrhea:  Penicillin      Kao-Pectate  Zithromax      Imodium AD         PUSH FLUIDS AVOID:       Cipro     Fever:  Tetracycline      Tylenol (Regular or Extra  Minocycline       Strength)  Levaquin      Extra Strength-Do not          Exceed 8 tabs/24 hrs Caffeine:        <200mg/day (equiv. To 1 cup of coffee or  approx. 3 12 oz sodas)         Gas: Cold/Hayfever:       Gas-X  Benadryl      Mylicon  Claritin       Phazyme  **Claritin-D        Chlor-Trimeton    Headaches:  Dimetapp      ASA-Free Excedrin  Drixoral-Non-Drowsy     Cold Compress  Mucinex (Guaifenasin)     Tylenol (Regular or Extra  Sudafed/Sudafed-12 Hour     Strength)  **Sudafed PE Pseudoephedrine   Tylenol Cold & Sinus     Vicks Vapor Rub  Zyrtec  **AVOID if Problems With Blood Pressure         Heartburn: Avoid lying down for at least 1 hour after meals  Aciphex      Maalox     Rash:  Milk of  Magnesia     Benadryl    Mylanta       1% Hydrocortisone Cream  Pepcid  Pepcid Complete   Sleep Aids:  Prevacid      Ambien   Prilosec       Benadryl  Rolaids       Chamomile Tea  Tums (Limit 4/day)     Unisom         Tylenol PM         Warm milk-add vanilla or  Hemorrhoids:       Sugar for taste  Anusol/Anusol H.C.  (RX: Analapram 2.5%)  Sugar Substitutes:    Hydrocortisone OTC     Ok in moderation  Preparation H      Tucks        Vaseline lotion applied to tissue with wiping    Herpes:     Throat:  Acyclovir      Oragel  Famvir  Valtrex     Vaccines:         Flu Shot Leg Cramps:       *Gardasil  Benadryl      Hepatitis A         Hepatitis B Nasal Spray:       Pneumovax  Saline Nasal Spray     Polio Booster         Tetanus Nausea:       Tuberculosis test or PPD  Vitamin B6 25 mg TID   AVOID:    Dramamine      *Gardasil  Emetrol       Live Poliovirus  Ginger Root 250 mg QID    MMR (measles, mumps &  High Complex Carbs @ Bedtime    rebella)  Sea Bands-Accupressure    Varicella (Chickenpox)  Unisom 1/2 tab TID     *No known complications           If received before Pain:         Known pregnancy;   Darvocet       Resume series after  Lortab        Delivery  Percocet    Yeast:   Tramadol      Femstat  Tylenol 3      Gyne-lotrimin  Ultram       Monistat  Vicodin           MISC:         All Sunscreens           Hair Coloring/highlights          Insect Repellant's          (Including DEET)         Mystic Tans  

## 2022-06-27 ENCOUNTER — Telehealth: Payer: Self-pay

## 2022-06-30 ENCOUNTER — Other Ambulatory Visit: Payer: Medicaid Other

## 2022-07-06 DIAGNOSIS — Z419 Encounter for procedure for purposes other than remedying health state, unspecified: Secondary | ICD-10-CM | POA: Diagnosis not present

## 2022-07-08 ENCOUNTER — Encounter: Payer: Self-pay | Admitting: Obstetrics

## 2022-07-08 ENCOUNTER — Encounter: Payer: Medicaid Other | Admitting: Licensed Practical Nurse

## 2022-07-08 NOTE — Telephone Encounter (Signed)
noted 

## 2022-07-11 ENCOUNTER — Ambulatory Visit (INDEPENDENT_AMBULATORY_CARE_PROVIDER_SITE_OTHER): Payer: Medicaid Other | Admitting: Obstetrics

## 2022-07-11 ENCOUNTER — Encounter: Payer: Self-pay | Admitting: Obstetrics

## 2022-07-11 VITALS — BP 128/86 | HR 85 | Ht 61.0 in | Wt 125.0 lb

## 2022-07-11 DIAGNOSIS — Z3009 Encounter for other general counseling and advice on contraception: Secondary | ICD-10-CM | POA: Diagnosis not present

## 2022-07-11 DIAGNOSIS — Z3480 Encounter for supervision of other normal pregnancy, unspecified trimester: Secondary | ICD-10-CM

## 2022-07-11 DIAGNOSIS — Z8759 Personal history of other complications of pregnancy, childbirth and the puerperium: Secondary | ICD-10-CM | POA: Diagnosis not present

## 2022-07-11 DIAGNOSIS — O039 Complete or unspecified spontaneous abortion without complication: Secondary | ICD-10-CM

## 2022-07-11 MED ORDER — NORELGESTROMIN-ETH ESTRADIOL 150-35 MCG/24HR TD PTWK: 1.0000 | MEDICATED_PATCH | TRANSDERMAL | 4 refills | Status: AC

## 2022-07-11 NOTE — Progress Notes (Signed)
    GYN ENCOUNTER  Encounter for SAB Follow Up  Subjective  HPI: Nancy Strong is a 32 y.o. [id number] who presents today for follow up after SAB 06/24/22. She reports that she felt her water break, had some bleeding and clots, and then passed the pregnancy spontaneously. She has had bleeding since then, which has gotten lighter. She denies cramps, pain, and s/s of infection. She desires contraception at this time.  Past Medical History:  Diagnosis Date   History of ectopic pregnancy    x 2   History of PCOS    Past Surgical History:  Procedure Laterality Date   CESAREAN SECTION     CESAREAN SECTION  07/24/2021   Procedure: CESAREAN SECTION;  Surgeon: Hildred Laser, MD;  Location: [facility] ORS;  Service: Obstetrics;;   OB History as of 07/08/2022     Gravida  7   Para  2   Term  2   Preterm      AB  4   Living  2      SAB  1   IAB      Ectopic  3   Multiple  0   Live Births  2          No Known Allergies  Constitutional: Denied constitutional symptoms, night sweats, recent illness, fatigue, fever, insomnia and weight loss.  Genitourinary: Denied genitourinary symptoms including symptomatic vaginal discharge, pelvic relaxation issues, and urinary complaints.  Psychiatric: Denied psychiatric symptoms, anxiety and depression.    Objective  BP 128/86   Pulse 85   Ht 5\' 1"  (1.549 m)   Wt 125 lb (56.7 kg)   LMP 04/09/2022 (Approximate)   Breastfeeding No   BMI 23.62 kg/m   Physical examination   Pelvic:   Vulva: Normal appearance.  No lesions.  Vagina: No lesions or abnormalities noted.  Support: Normal pelvic support.  Urethra No masses tenderness or scarring.  Meatus Normal size without lesions or prolapse.  Cervix: Normal appearance.  No lesions. Dark red blood noted.  Perineum: Normal exam.  No lesions.        Bimanual   Uterus: Approximately 10-week size  Non-tender.  Mobile.  AV.  Adnexae: No masses.  Non-tender to palpation.  Cul-de-sac:  Negative for abnormality.    Assessment 1) SAB, 11 weeks, complete 2) Desires contraception  Plan 1) bhCG today. Will follow to pre-pregnancy levels. Discussed testing for causes of recurrent miscarriage if desired. Genelle declines at this time. 2) Rx for Xulane patch sent to pharmacy on file. Reviewed proper use. Selin has used this method in the past.   Reviewed s/s of infection and retained POC. RTC for annual visit or PRN.  Guadlupe Spanish, CNM

## 2022-07-12 ENCOUNTER — Encounter: Payer: Self-pay | Admitting: Obstetrics

## 2022-07-12 ENCOUNTER — Other Ambulatory Visit: Payer: Self-pay | Admitting: Obstetrics

## 2022-07-12 DIAGNOSIS — O039 Complete or unspecified spontaneous abortion without complication: Secondary | ICD-10-CM

## 2022-07-12 LAB — HUMAN CHORIONIC GONADOTROPIN(HCG),B-SUBUNIT,QUANTITATIVE): HCG, Beta Chain, Quant, S: 323 m[IU]/mL

## 2022-08-06 DIAGNOSIS — Z419 Encounter for procedure for purposes other than remedying health state, unspecified: Secondary | ICD-10-CM | POA: Diagnosis not present

## 2022-09-05 DIAGNOSIS — Z419 Encounter for procedure for purposes other than remedying health state, unspecified: Secondary | ICD-10-CM | POA: Diagnosis not present

## 2022-10-06 DIAGNOSIS — Z419 Encounter for procedure for purposes other than remedying health state, unspecified: Secondary | ICD-10-CM | POA: Diagnosis not present

## 2022-11-06 DIAGNOSIS — Z419 Encounter for procedure for purposes other than remedying health state, unspecified: Secondary | ICD-10-CM | POA: Diagnosis not present

## 2022-12-06 DIAGNOSIS — Z419 Encounter for procedure for purposes other than remedying health state, unspecified: Secondary | ICD-10-CM | POA: Diagnosis not present

## 2022-12-22 DIAGNOSIS — Z7689 Persons encountering health services in other specified circumstances: Secondary | ICD-10-CM | POA: Diagnosis not present

## 2022-12-22 DIAGNOSIS — Z13228 Encounter for screening for other metabolic disorders: Secondary | ICD-10-CM | POA: Diagnosis not present

## 2022-12-22 DIAGNOSIS — Z130 Encounter for screening for diseases of the blood and blood-forming organs and certain disorders involving the immune mechanism: Secondary | ICD-10-CM | POA: Diagnosis not present

## 2022-12-22 DIAGNOSIS — D509 Iron deficiency anemia, unspecified: Secondary | ICD-10-CM | POA: Diagnosis not present

## 2022-12-22 DIAGNOSIS — Z1331 Encounter for screening for depression: Secondary | ICD-10-CM | POA: Diagnosis not present

## 2022-12-22 DIAGNOSIS — Z1329 Encounter for screening for other suspected endocrine disorder: Secondary | ICD-10-CM | POA: Diagnosis not present

## 2022-12-22 DIAGNOSIS — Z1330 Encounter for screening examination for mental health and behavioral disorders, unspecified: Secondary | ICD-10-CM | POA: Diagnosis not present

## 2022-12-22 DIAGNOSIS — Z13220 Encounter for screening for lipoid disorders: Secondary | ICD-10-CM | POA: Diagnosis not present

## 2023-01-06 DIAGNOSIS — Z419 Encounter for procedure for purposes other than remedying health state, unspecified: Secondary | ICD-10-CM | POA: Diagnosis not present

## 2023-01-23 DIAGNOSIS — Z1331 Encounter for screening for depression: Secondary | ICD-10-CM | POA: Diagnosis not present

## 2023-01-23 DIAGNOSIS — F419 Anxiety disorder, unspecified: Secondary | ICD-10-CM | POA: Diagnosis not present

## 2023-01-23 DIAGNOSIS — Z1330 Encounter for screening examination for mental health and behavioral disorders, unspecified: Secondary | ICD-10-CM | POA: Diagnosis not present

## 2023-01-31 ENCOUNTER — Emergency Department
Admission: EM | Admit: 2023-01-31 | Discharge: 2023-01-31 | Disposition: A | Discharge status: Home / Self Care | Payer: Medicaid Other | Attending: Emergency Medicine | Admitting: Emergency Medicine

## 2023-01-31 ENCOUNTER — Other Ambulatory Visit: Payer: Self-pay

## 2023-01-31 ENCOUNTER — Emergency Department: Payer: Medicaid Other

## 2023-01-31 DIAGNOSIS — D649 Anemia, unspecified: Secondary | ICD-10-CM | POA: Insufficient documentation

## 2023-01-31 DIAGNOSIS — R0789 Other chest pain: Secondary | ICD-10-CM | POA: Diagnosis not present

## 2023-01-31 DIAGNOSIS — R079 Chest pain, unspecified: Secondary | ICD-10-CM | POA: Diagnosis not present

## 2023-01-31 LAB — BASIC METABOLIC PANEL
Anion gap: 8 (ref 5–15)
BUN: 13 mg/dL (ref 6–20)
CO2: 26 mmol/L (ref 22–32)
Calcium: 9.2 mg/dL (ref 8.9–10.3)
Chloride: 104 mmol/L (ref 98–111)
Creatinine, Ser: 0.88 mg/dL (ref 0.44–1.00)
GFR, Estimated: >60 mL/min (ref >60)
Glucose, Bld: 113 mg/dL — ABNORMAL HIGH (ref 70–99)
Potassium: 3.4 mmol/L — ABNORMAL LOW (ref 3.5–5.1)
Sodium: 138 mmol/L (ref 135–145)

## 2023-01-31 LAB — CBC
HCT: 34.4 % — ABNORMAL LOW (ref 36.0–46.0)
Hemoglobin: 11.3 g/dL — ABNORMAL LOW (ref 12.0–15.0)
MCH: 26.5 pg (ref 26.0–34.0)
MCHC: 32.8 g/dL (ref 30.0–36.0)
MCV: 80.6 fL (ref 80.0–100.0)
Platelets: 192 10*3/uL (ref 150–400)
RBC: 4.27 MIL/uL (ref 3.87–5.11)
RDW: 14.2 % (ref 11.5–15.5)
WBC: 5.3 10*3/uL (ref 4.0–10.5)
nRBC: 0 % (ref 0.0–0.2)

## 2023-01-31 LAB — TROPONIN I (HIGH SENSITIVITY): Troponin I (High Sensitivity): <2 ng/L (ref <18)

## 2023-01-31 NOTE — ED Provider Notes (Signed)
[facility] Provider Note    Event Date/Time   First MD Initiated Contact with Patient 01/31/23 [phone]     (approximate)   History   Chest Pain   HPI  Nancy Strong is a 32 year old female with history of anxiety disorder presenting to the emergency department for evaluation of chest pain.  Last night, patient had onset of chest pain and shortness of breath consistent with similar episodes that she attributes to her anxiety.  Earlier this morning, she developed some muscle spasm type pain in her back in addition to some recurrent chest pain.  Reports that this has now resolved, but the symptoms lasted slightly longer than her typical episodes that she attributes to her anxiety which was concerning for her.    Physical Exam   Triage Vital Signs: ED Triage Vitals  Encounter Vitals Group     BP 01/31/23 0804 (!) 144/88     Systolic BP Percentile --      Diastolic BP Percentile --      Pulse Rate 01/31/23 0804 83     Resp 01/31/23 0804 18     Temp 01/31/23 0804 98.3 F (36.8 C)     Temp src --      SpO2 01/31/23 0804 100 %     Weight 01/31/23 0801 127 lb (57.6 kg)     Height 01/31/23 0801 5\' 1"  (1.549 m)     Head Circumference --      Peak Flow --      Pain Score 01/31/23 0801 8     Pain Loc --      Pain Education --      Exclude from Growth Chart --     Most recent vital signs: Vitals:   01/31/23 0804  BP: (!) 144/88  Pulse: 83  Resp: 18  Temp: 98.3 F (36.8 C)  SpO2: 100%     General: Awake, interactive  CV:  Regular rate, good peripheral perfusion.  Resp:  Unlabored respirations, lungs clear to auscultation Abd:  Nondistended.  Neuro:  Symmetric facial movement, fluid speech   ED Results / Procedures / Treatments   Labs (all labs ordered are listed, but only abnormal results are displayed) Labs Reviewed  BASIC METABOLIC PANEL - Abnormal; Notable for the following components:      Result Value   Potassium 3.4 (*)     Glucose, Bld 113 (*)    All other components within normal limits  CBC - Abnormal; Notable for the following components:   Hemoglobin 11.3 (*)    HCT 34.4 (*)    All other components within normal limits  POC URINE PREG, ED  TROPONIN I (HIGH SENSITIVITY)     EKG EKG independently reviewed interpreted by myself (ER attending) demonstrates:  EKG demonstrates normal sinus rhythm rate of 80, PR 150, QRS 72, QTc 396, no acute ST changes  RADIOLOGY Imaging independently reviewed and interpreted by myself demonstrates:  CXR without focal consolidation  PROCEDURES:  Critical Care performed: No  Procedures   MEDICATIONS ORDERED IN ED: Medications - No data to display   IMPRESSION / MDM / ASSESSMENT AND PLAN / ED COURSE  I reviewed the triage vital signs and the nursing notes.  Differential diagnosis includes, but is not limited to, anemia, electrolyte abnormality, ACS, low risk PE and PERC negative, stress mediated physiologic response  Patient's presentation is most consistent with acute presentation with potential threat to life or bodily function.  32 year old female presenting to  the emergency department for evaluation of chest pain.  Vital stable on presentation.  Labs overall reassuring.  Stable anemia.  Negative troponin.  Discussed repeat troponin given worsened symptoms this morning, but patient prefers to hold off which I do think is reasonable since her symptoms started last night.  Currently chest pain-free.  She has close follow-up already arranged with her primary care doctor.  Do think she is stable for discharge home.  Strict return precautions provided.  Patient discharged stable condition.      FINAL CLINICAL IMPRESSION(S) / ED DIAGNOSES   Final diagnoses:  Nonspecific chest pain     Rx / DC Orders   ED Discharge Orders     None        Note:  This document was prepared using Dragon voice recognition software and may include unintentional dictation  errors.   Trinna Post, MD 01/31/23 [phone]

## 2023-01-31 NOTE — ED Triage Notes (Signed)
Pt to ED for chest pressure started this am with shob and nausea. Reports has felt similar with anxiety.

## 2023-01-31 NOTE — Discharge Instructions (Signed)
You were seen in the Emergency Department today for evaluation of your chest pain. Fortunately, your labs, EKG, and chest x-[facility]asm[facility] Bistline were overall reassuring against a emergency cause for your pain. Please follow-up with your primary doctor within the next few days for reevaluation. You can take Tylenol and ibuprofen as needed for your pain, unless there is another reason that you should not take these. Return to the ER for any new or worsening symptoms including worsening chest pain, difficulty breathing, or any other new or concerning symptoms that you believe warrants immediate attention.

## 2023-02-05 DIAGNOSIS — Z419 Encounter for procedure for purposes other than remedying health state, unspecified: Secondary | ICD-10-CM | POA: Diagnosis not present

## 2023-02-06 DIAGNOSIS — F419 Anxiety disorder, unspecified: Secondary | ICD-10-CM | POA: Diagnosis not present

## 2023-02-06 DIAGNOSIS — Z1331 Encounter for screening for depression: Secondary | ICD-10-CM | POA: Diagnosis not present

## 2023-03-08 DIAGNOSIS — Z419 Encounter for procedure for purposes other than remedying health state, unspecified: Secondary | ICD-10-CM | POA: Diagnosis not present

## 2023-04-08 DIAGNOSIS — Z419 Encounter for procedure for purposes other than remedying health state, unspecified: Secondary | ICD-10-CM | POA: Diagnosis not present

## 2023-05-06 DIAGNOSIS — Z419 Encounter for procedure for purposes other than remedying health state, unspecified: Secondary | ICD-10-CM | POA: Diagnosis not present

## 2023-05-17 DIAGNOSIS — Z419 Encounter for procedure for purposes other than remedying health state, unspecified: Secondary | ICD-10-CM | POA: Diagnosis not present

## 2023-06-17 DIAGNOSIS — Z419 Encounter for procedure for purposes other than remedying health state, unspecified: Secondary | ICD-10-CM | POA: Diagnosis not present

## 2023-07-17 DIAGNOSIS — Z419 Encounter for procedure for purposes other than remedying health state, unspecified: Secondary | ICD-10-CM | POA: Diagnosis not present

## 2023-08-17 DIAGNOSIS — Z419 Encounter for procedure for purposes other than remedying health state, unspecified: Secondary | ICD-10-CM | POA: Diagnosis not present

## 2023-09-16 DIAGNOSIS — Z419 Encounter for procedure for purposes other than remedying health state, unspecified: Secondary | ICD-10-CM | POA: Diagnosis not present

## 2023-09-22 DIAGNOSIS — L219 Seborrheic dermatitis, unspecified: Secondary | ICD-10-CM | POA: Diagnosis not present

## 2023-09-22 DIAGNOSIS — L659 Nonscarring hair loss, unspecified: Secondary | ICD-10-CM | POA: Diagnosis not present

## 2023-09-22 DIAGNOSIS — D649 Anemia, unspecified: Secondary | ICD-10-CM | POA: Diagnosis not present

## 2023-09-22 DIAGNOSIS — Z131 Encounter for screening for diabetes mellitus: Secondary | ICD-10-CM | POA: Diagnosis not present

## 2023-09-22 DIAGNOSIS — R519 Headache, unspecified: Secondary | ICD-10-CM | POA: Diagnosis not present

## 2023-09-22 DIAGNOSIS — R21 Rash and other nonspecific skin eruption: Secondary | ICD-10-CM | POA: Diagnosis not present

## 2023-09-22 DIAGNOSIS — L210 Seborrhea capitis: Secondary | ICD-10-CM | POA: Diagnosis not present

## 2023-09-25 DIAGNOSIS — R519 Headache, unspecified: Secondary | ICD-10-CM | POA: Diagnosis not present

## 2023-10-04 NOTE — Progress Notes (Deleted)
    GYNECOLOGY PROGRESS NOTE  Subjective:    Patient ID: Nancy Strong, female    DOB: September 23, 1990, 33 y.o.   MRN: [id number]  HPI  Patient is a 33 y.o. [id number] female who presents for evaluation of spots underneath her breast.  {Common ambulatory SmartLinks:19316}  Review of Systems {ros; complete:30496}   Objective:   There were no vitals taken for this visit. There is no height or weight on file to calculate BMI. General appearance: {general exam:16600} Abdomen: {abdominal exam:16834} Pelvic: {pelvic exam:16852::cervix normal in appearance,external genitalia normal,no adnexal masses or tenderness,no cervical motion tenderness,rectovaginal septum normal,uterus normal size, shape, and consistency,vagina normal without discharge} Extremities: {extremity exam:5109} Neurologic: {neuro exam:17854}   Assessment:   No diagnosis found.   Plan:   There are no diagnoses linked to this encounter.     Damien Parsley, CNM [facility]

## 2023-10-06 ENCOUNTER — Ambulatory Visit: Admitting: Certified Nurse Midwife

## 2023-10-17 DIAGNOSIS — Z419 Encounter for procedure for purposes other than remedying health state, unspecified: Secondary | ICD-10-CM | POA: Diagnosis not present

## 2023-11-17 DIAGNOSIS — Z419 Encounter for procedure for purposes other than remedying health state, unspecified: Secondary | ICD-10-CM | POA: Diagnosis not present

## 2024-01-24 DIAGNOSIS — N751 Abscess of Bartholin's gland: Secondary | ICD-10-CM | POA: Diagnosis not present

## 2024-01-25 NOTE — Progress Notes (Signed)
 Patient, No Pcp Per   Chief Complaint  Patient presents with   Bartholin's Cyst    HPI:      Nancy Strong is a 33 y.o. [id number] whose LMP was No LMP recorded., presents today for Bartholin cyst on the left side. Patient states it has busted on Tuesday. She has been doing sitz baths. She saw her PCP who counseled her well and cultured the exudate. She was told to keep her appointment here. Nancy Strong denies any concerns with the cyst today, but wants to be sure it is healing.     Patient Active Problem List   Diagnosis Date Noted   Labor and delivery indication for care or intervention 07/24/2021   Rh negative state in antepartum period 03/04/2021   History of cesarean delivery 03/04/2021   History of prior pregnancy with IUGR newborn 03/04/2021   Right tubal pregnancy without intrauterine pregnancy 08/22/2016    Past Surgical History:  Procedure Laterality Date   CESAREAN SECTION     CESAREAN SECTION  07/24/2021   Procedure: CESAREAN SECTION;  Surgeon: Connell Davies, MD;  Location: [facility] ORS;  Service: Obstetrics;;    Family History  Problem Relation Age of Onset   Healthy Mother    Hypertension Father    Breast cancer Sister 68   Healthy Brother    Healthy Brother    Healthy Brother    Healthy Brother    Healthy Brother    Diabetes Maternal Grandmother    Stroke Maternal Grandfather    Healthy Paternal Grandmother    Healthy Paternal Grandfather    Breast cancer Maternal Aunt 40   Diabetes Maternal Uncle    Ovarian cancer Neg Hx     Social History   Socioeconomic History   Marital status: Single    Spouse name: Not on file   Number of children: 2   Years of education: 12+   Highest education level: High school graduate  Occupational History   Occupation: clinical biochemist rep for ins  Tobacco Use   Smoking status: Never   Smokeless tobacco: Never  Vaping Use   Vaping status: Never Used  Substance and Sexual Activity   Alcohol use: Not  Currently   Drug use: Never   Sexual activity: Yes    Partners: Male    Birth control/protection: None  Other Topics Concern   Not on file  Social History Narrative   Not on file   Social Drivers of Health   Financial Resource Strain: Low Risk  (12/22/2022)   Received from [facility]   Overall Financial Resource Strain (CARDIA)    Difficulty of Paying Living Expenses: Not hard at all  Food Insecurity: Unknown (12/22/2022)   Received from [facility]   Hunger Vital Sign    Worried About Running Out of Food in the Last Year: Not on file    Within the past 12 months, the food you bought just didn't last and you didn't have money to get more.: Never true  Transportation Needs: No Transportation Needs (12/22/2022)   Received from [facility] - Transportation    In the past 12 months, has lack of transportation kept you from medical appointments or from getting medications?: No    Lack of Transportation (Non-Medical): No  Physical Activity: Inactive (06/24/2022)   Exercise Vital Sign    Days of Exercise per Week: 0 days    Minutes of Exercise per Session:  0 min  Stress: No Stress Concern Present (06/24/2022)   [employer] of Occupational Health - Occupational Stress Questionnaire    Feeling of Stress : Not at all  Social Connections: Moderately Isolated (06/24/2022)   Social Connection and Isolation Panel    Frequency of Communication with Friends and Family: More than three times a week    Frequency of Social Gatherings with Friends and Family: More than three times a week    Attends Religious Services: Never    Database Administrator or Organizations: No    Attends Banker Meetings: Never    Marital Status: Living with partner  Intimate Partner Violence: Not At Risk (09/25/2023)   Received from [facility]   Humiliation, Afraid, Rape, and Kick questionnaire    Within the last year, have you  been afraid of your partner or ex-partner?: No    Within the last year, have you been humiliated or emotionally abused in other ways by your partner or ex-partner?: No    Within the last year, have you been kicked, hit, slapped, or otherwise physically hurt by your partner or ex-partner?: No    Within the last year, have you been raped or forced to have any kind of sexual activity by your partner or ex-partner?: No    Outpatient Medications Prior to Visit  Medication Sig Dispense Refill   acetaminophen  (TYLENOL ) 500 MG tablet Take 2 tablets (1,000 mg total) by mouth every 6 (six) hours. 30 tablet 0   coconut oil OIL Apply 1 application. topically as needed. (Patient not taking: Reported on 06/24/2022)  0   ferrous sulfate  325 (65 FE) MG tablet Take 1 tablet (325 mg total) by mouth 2 (two) times daily with a meal. (Patient not taking: Reported on 06/24/2022) 30 tablet 3   norelgestromin -ethinyl estradiol  (XULANE) 150-35 MCG/24HR transdermal patch Place 1 patch onto the skin once a week. (Patient not taking: Reported on 01/26/2024) 9 patch 4   Prenatal Vit-Fe Fumarate-FA (MULTIVITAMIN-PRENATAL) 27-0.8 MG TABS tablet Take 1 tablet by mouth daily at 12 noon. (Patient not taking: Reported on 01/26/2024)     simethicone  (MYLICON) 80 MG chewable tablet Chew 1 tablet (80 mg total) by mouth as needed for flatulence. (Patient not taking: Reported on 06/24/2022) 30 tablet 0   No facility-administered medications prior to visit.      ROS:  Review of Systems see HPI    OBJECTIVE:   Vitals:  BP 118/77   Pulse 89   Ht 5' 1 (1.549 m)   Wt 123 lb (55.8 kg)   BMI 23.24 kg/m   Physical Exam Constitutional:      Appearance: Normal appearance.  Cardiovascular:     Rate and Rhythm: Normal rate.  Pulmonary:     Effort: Pulmonary effort is normal.  Genitourinary:    General: Normal vulva.     Comments: Left labia: slight tenderness where cyst opened, minimal swelling no redness or discoloration   Skin:    General: Skin is warm.  Neurological:     General: No focal deficit present.     Mental Status: She is alert.  Psychiatric:        Mood and Affect: Mood normal.        Thought Content: Thought content normal.     Results: No results found for this or any previous visit (from the past 24 hours).   Assessment/Plan: Bartholin gland cyst  -The cyst appears to be almost completely healed. Continue sitz bath  for another day or 2. Please return if cyst come back, may consider Ward catheter at that time.   -Schedule annual exam   No orders of the defined types were placed in this encounter.    JINNIE HERO Marquelle Balow, CNM 01/26/2024 9:48 AM

## 2024-01-26 ENCOUNTER — Ambulatory Visit (INDEPENDENT_AMBULATORY_CARE_PROVIDER_SITE_OTHER): Admitting: Licensed Practical Nurse

## 2024-01-26 VITALS — BP 118/77 | HR 89 | Ht 61.0 in | Wt 123.0 lb

## 2024-01-26 DIAGNOSIS — N750 Cyst of Bartholin's gland: Secondary | ICD-10-CM

## 2024-01-30 ENCOUNTER — Encounter: Payer: Self-pay | Admitting: Obstetrics

## 2024-01-30 NOTE — Progress Notes (Deleted)
 GYNECOLOGY ANNUAL PHYSICAL EXAM NOTE  Subjective:    Nancy Strong is a 32 y.o. [phone] female who presents for an annual exam.  The patient {is/is not/has never been:13135} sexually active. The patient participates in regular exercise: {yes/no/not asked:9010}. Has the patient ever been transfused or tattooed?: {yes/no/not asked:9010}. The patient reports that there {is/is not:9024} domestic violence in her life. The patient has completed the Gardasil vaccine: {yes/no:311178}  The patient has the following complaints today: ***  Menstrual History: Menarche age: *** No LMP recorded.     Gynecologic History:  Contraception: {method:5051} History of STI's:  Last Pap: 07/23/2020. Results were: ASCUS HPV negative History of abnormal pap(s): *** Last mammogram: NA       OB History  Gravida Para Term Preterm AB Living  7 2 2  0 4 2  SAB IAB Ectopic Multiple Live Births  1 0 3 0 2    # Outcome Date GA Lbr Len/2nd Weight Sex Type Anes PTL Lv  7 Gravida           6 Term 07/24/21 [redacted]w[redacted]d  7 lb 9 oz (3.43 kg) F CS-LTranv Spinal  LIV     Name: Gabrielson,GIRL Dorene     Apgar1: 8  Apgar5: 9  5 SAB 01/06/20        ND  4 Term 03/03/19 [redacted]w[redacted]d  5 lb 8 oz (2.495 kg) M CS-LTranv   LIV     Complications: IUGR (intrauterine growth restriction) affecting care of mother, Failure to progress in labor  3 Ectopic 2018          2 Ectopic 2016     ECTOPIC     1 Ectopic 2014            Past Medical History:  Diagnosis Date   History of ectopic pregnancy    x 2   History of PCOS     Past Surgical History:  Procedure Laterality Date   CESAREAN SECTION     CESAREAN SECTION  07/24/2021   Procedure: CESAREAN SECTION;  Surgeon: Connell Davies, MD;  Location: [facility] ORS;  Service: Obstetrics;;    Family History  Problem Relation Age of Onset   Healthy Mother    Hypertension Father    Breast cancer Sister 26   Healthy Brother    Healthy Brother    Healthy Brother    Healthy  Brother    Healthy Brother    Diabetes Maternal Grandmother    Stroke Maternal Grandfather    Healthy Paternal Grandmother    Healthy Paternal Grandfather    Breast cancer Maternal Aunt 40   Diabetes Maternal Uncle    Ovarian cancer Neg Hx     Social History   Socioeconomic History   Marital status: Single    Spouse name: Not on file   Number of children: 2   Years of education: 12+   Highest education level: High school graduate  Occupational History   Occupation: clinical biochemist rep for ins  Tobacco Use   Smoking status: Never   Smokeless tobacco: Never  Vaping Use   Vaping status: Never Used  Substance and Sexual Activity   Alcohol use: Not Currently   Drug use: Never   Sexual activity: Yes    Partners: Male    Birth control/protection: None  Other Topics Concern   Not on file  Social History Narrative   Not on file   Social Drivers of Health   Financial Resource Strain: Low Risk  (  12/22/2022)   Received from [facility]   Overall Financial Resource Strain (CARDIA)    Difficulty of Paying Living Expenses: Not hard at all  Food Insecurity: Unknown (12/22/2022)   Received from [facility]   Hunger Vital Sign    Worried About Running Out of Food in the Last Year: Not on file    Within the past 12 months, the food you bought just didn't last and you didn't have money to get more.: Never true  Transportation Needs: No Transportation Needs (12/22/2022)   Received from [facility] - Transportation    In the past 12 months, has lack of transportation kept you from medical appointments or from getting medications?: No    Lack of Transportation (Non-Medical): No  Physical Activity: Inactive (06/24/2022)   Exercise Vital Sign    Days of Exercise per Week: 0 days    Minutes of Exercise per Session: 0 min  Stress: No Stress Concern Present (06/24/2022)   [employer] of Occupational Health -  Occupational Stress Questionnaire    Feeling of Stress : Not at all  Social Connections: Moderately Isolated (06/24/2022)   Social Connection and Isolation Panel    Frequency of Communication with Friends and Family: More than three times a week    Frequency of Social Gatherings with Friends and Family: More than three times a week    Attends Religious Services: Never    Database Administrator or Organizations: No    Attends Banker Meetings: Never    Marital Status: Living with partner  Intimate Partner Violence: Not At Risk (09/25/2023)   Received from [facility]   Humiliation, Afraid, Rape, and Kick questionnaire    Within the last year, have you been afraid of your partner or ex-partner?: No    Within the last year, have you been humiliated or emotionally abused in other ways by your partner or ex-partner?: No    Within the last year, have you been kicked, hit, slapped, or otherwise physically hurt by your partner or ex-partner?: No    Within the last year, have you been raped or forced to have any kind of sexual activity by your partner or ex-partner?: No    Current Outpatient Medications on File Prior to Visit  Medication Sig Dispense Refill   acetaminophen  (TYLENOL ) 500 MG tablet Take 2 tablets (1,000 mg total) by mouth every 6 (six) hours. 30 tablet 0   coconut oil OIL Apply 1 application. topically as needed. (Patient not taking: Reported on 06/24/2022)  0   ferrous sulfate  325 (65 FE) MG tablet Take 1 tablet (325 mg total) by mouth 2 (two) times daily with a meal. (Patient not taking: Reported on 06/24/2022) 30 tablet 3   norelgestromin -ethinyl estradiol  (XULANE) 150-35 MCG/24HR transdermal patch Place 1 patch onto the skin once a week. (Patient not taking: Reported on 01/26/2024) 9 patch 4   Prenatal Vit-Fe Fumarate-FA (MULTIVITAMIN-PRENATAL) 27-0.8 MG TABS tablet Take 1 tablet by mouth daily at 12 noon. (Patient not taking: Reported on 01/26/2024)     simethicone   (MYLICON) 80 MG chewable tablet Chew 1 tablet (80 mg total) by mouth as needed for flatulence. (Patient not taking: Reported on 06/24/2022) 30 tablet 0   No current facility-administered medications on file prior to visit.    No Known Allergies   Review of Systems Constitutional: negative for chills, fatigue, fevers and sweats Eyes: negative for irritation, redness and visual disturbance  Ears, nose, mouth, throat, and face: negative for hearing loss, nasal congestion, snoring and tinnitus Respiratory: negative for asthma, cough, sputum Cardiovascular: negative for chest pain, dyspnea, exertional chest pressure/discomfort, irregular heart beat, palpitations and syncope Gastrointestinal: negative for abdominal pain, change in bowel habits, nausea and vomiting Genitourinary: negative for abnormal menstrual periods, genital lesions, sexual problems and vaginal discharge, dysuria and urinary incontinence Integument/breast: negative for breast lump, breast tenderness and nipple discharge Hematologic/lymphatic: negative for bleeding and easy bruising Musculoskeletal:negative for back pain and muscle weakness Neurological: negative for dizziness, headaches, vertigo and weakness Endocrine: negative for diabetic symptoms including polydipsia, polyuria and skin dryness Allergic/Immunologic: negative for hay fever and urticaria      Objective:  There were no vitals taken for this visit. There is no height or weight on file to calculate BMI.    General Appearance:    Alert, cooperative, no distress, appears stated age  Head:    Normocephalic, without obvious abnormality, atraumatic  Eyes:    Conjunctiva/corneas clear, EOMs intact bilaterally  Ears:    Normal external ear canals bilaterally  Nose:   Nares normal  Throat:   Lips, mucosa, and tongue normal; teeth and gums normal  Neck:   Supple, symmetrical, trachea midline, no adenopathy; thyroid : no enlargement/tenderness/nodules; no carotid bruit  or JVD  Back:     ROM normal, no CVA tenderness  Lungs:     Clear to auscultation bilaterally, respirations unlabored  Chest Wall:    No tenderness or deformity   Heart:    Regular rate and rhythm, S1 and S2 normal, no appreciated murmur, rub or gallop  Breast Exam:    No tenderness, masses, or nipple abnormality; no skin retraction, dimpling or nipple discharge.  Abdomen:     Soft, non-tender, bowel sounds active all four quadrants, no masses, no organomegaly.    Genitalia:    Pelvic: external genitalia normal, vagina without lesions, discharge, or tenderness, rectovaginal septum  normal. Cervix normal in appearance, no cervical motion tenderness, no adnexal masses or tenderness.  Uterus normal size, shape, mobile, regular contours, nontender.  Rectal:    Normal external sphincter.  No hemorrhoids appreciated. Internal exam not done.   Extremities:   Extremities normal, atraumatic, no cyanosis or edema  Pulses:   2+ and symmetric all extremities  Skin:   Skin color, texture, turgor normal, no rashes or lesions  Lymph nodes:   Cervical, supraclavicular, and axillary nodes normal  Neurologic:   CNII-XII grossly intact, normal strength, sensation and reflexes throughout   .  Labs:  Lab Results  Component Value Date   WBC 5.3 01/31/2023   HGB 11.3 (L) 01/31/2023   HCT 34.4 (L) 01/31/2023   MCV 80.6 01/31/2023   PLT 192 01/31/2023    Lab Results  Component Value Date   CREATININE 0.88 01/31/2023   BUN 13 01/31/2023   NA 138 01/31/2023   K 3.4 (L) 01/31/2023   CL 104 01/31/2023   CO2 26 01/31/2023    Lab Results  Component Value Date   ALT 12 01/14/2020   AST 16 01/14/2020   ALKPHOS 52 01/14/2020   BILITOT 1.1 01/14/2020    No results found for: TSH   Assessment:   No diagnosis found.   Plan:   Nancy Strong is a 33 y.o. [phone] female here today for her annual exam, doing well.  Pap: done with cotesting today  *** w/rflx today Mammogram: ordered***due  *** Labs: ***A1C, CMP, HepC, Lipid panel, Vit D, TSH  PHQ-2 = ***, discussed coping techniques; RTC if worsens or develops concern Contraception: *** Vaccines: Gardasil complete Healthy lifestyle modifications discussed: multivitamin, diet, exercise, sunscreen. Emphasized importance of regular physical activity.  Folic Acid recommendation reviewed.  All questions answered to patient's satisfaction.   Follow up 1 yr for annual, sooner prn.    Estil Mangle, DO [facility]

## 2024-02-08 DIAGNOSIS — F419 Anxiety disorder, unspecified: Secondary | ICD-10-CM | POA: Diagnosis not present

## 2024-02-08 DIAGNOSIS — F410 Panic disorder [episodic paroxysmal anxiety] without agoraphobia: Secondary | ICD-10-CM | POA: Diagnosis not present

## 2024-02-13 ENCOUNTER — Ambulatory Visit: Admitting: Obstetrics

## 2024-02-13 DIAGNOSIS — Z01419 Encounter for gynecological examination (general) (routine) without abnormal findings: Secondary | ICD-10-CM

## 2024-02-13 DIAGNOSIS — Z124 Encounter for screening for malignant neoplasm of cervix: Secondary | ICD-10-CM

## 2024-02-22 DIAGNOSIS — Z1331 Encounter for screening for depression: Secondary | ICD-10-CM | POA: Diagnosis not present

## 2024-02-22 DIAGNOSIS — F410 Panic disorder [episodic paroxysmal anxiety] without agoraphobia: Secondary | ICD-10-CM | POA: Diagnosis not present

## 2024-02-22 DIAGNOSIS — Z1330 Encounter for screening examination for mental health and behavioral disorders, unspecified: Secondary | ICD-10-CM | POA: Diagnosis not present

## 2024-02-22 DIAGNOSIS — Z8249 Family history of ischemic heart disease and other diseases of the circulatory system: Secondary | ICD-10-CM | POA: Diagnosis not present

## 2024-02-22 DIAGNOSIS — R0602 Shortness of breath: Secondary | ICD-10-CM | POA: Diagnosis not present

## 2024-02-22 DIAGNOSIS — R0789 Other chest pain: Secondary | ICD-10-CM | POA: Diagnosis not present

## 2024-05-08 ENCOUNTER — Ambulatory Visit: Admitting: Certified Nurse Midwife
